# Patient Record
Sex: Female | Born: 1960 | Race: White | Hispanic: No | Marital: Married | Smoking: Never smoker
Health system: Southern US, Community
[De-identification: ages and names within clinical notes are randomized; demographics above are authoritative.]

## PROBLEM LIST (undated history)

## (undated) DIAGNOSIS — T7840XA Allergy, unspecified, initial encounter: Secondary | ICD-10-CM

## (undated) DIAGNOSIS — C801 Malignant (primary) neoplasm, unspecified: Secondary | ICD-10-CM

## (undated) DIAGNOSIS — J302 Other seasonal allergic rhinitis: Secondary | ICD-10-CM

## (undated) DIAGNOSIS — E785 Hyperlipidemia, unspecified: Secondary | ICD-10-CM

## (undated) HISTORY — PX: BREAST LUMPECTOMY: SHX2

## (undated) HISTORY — DX: Malignant (primary) neoplasm, unspecified: C80.1

## (undated) HISTORY — PX: TUBAL LIGATION: SHX77

## (undated) HISTORY — PX: TONSILECTOMY, ADENOIDECTOMY, BILATERAL MYRINGOTOMY AND TUBES: SHX2538

## (undated) HISTORY — PX: APPENDECTOMY: SHX54

## (undated) HISTORY — DX: Allergy, unspecified, initial encounter: T78.40XA

---

## 1999-09-29 HISTORY — PX: PLACEMENT OF BREAST IMPLANTS: SHX6334

## 2008-09-28 HISTORY — PX: LAPAROSCOPIC REPAIR AND REMOVAL OF GASTRIC BAND: SHX5919

## 2015-04-05 DIAGNOSIS — M25552 Pain in left hip: Secondary | ICD-10-CM | POA: Insufficient documentation

## 2016-09-28 HISTORY — PX: REPLACEMENT TOTAL KNEE: SUR1224

## 2017-01-05 DIAGNOSIS — I7300 Raynaud's syndrome without gangrene: Secondary | ICD-10-CM | POA: Insufficient documentation

## 2017-01-05 DIAGNOSIS — Z853 Personal history of malignant neoplasm of breast: Secondary | ICD-10-CM | POA: Insufficient documentation

## 2017-01-05 DIAGNOSIS — J302 Other seasonal allergic rhinitis: Secondary | ICD-10-CM | POA: Insufficient documentation

## 2017-01-05 DIAGNOSIS — R55 Syncope and collapse: Secondary | ICD-10-CM | POA: Insufficient documentation

## 2017-03-02 DIAGNOSIS — H8113 Benign paroxysmal vertigo, bilateral: Secondary | ICD-10-CM | POA: Insufficient documentation

## 2019-09-04 ENCOUNTER — Other Ambulatory Visit: Payer: Self-pay | Admitting: General Surgery

## 2019-09-04 DIAGNOSIS — R1110 Vomiting, unspecified: Secondary | ICD-10-CM

## 2019-09-15 ENCOUNTER — Ambulatory Visit
Admission: RE | Admit: 2019-09-15 | Discharge: 2019-09-15 | Disposition: A | Discharge status: Home / Self Care | Payer: Self-pay | Source: Ambulatory Visit | Attending: General Surgery | Admitting: General Surgery

## 2019-09-15 ENCOUNTER — Other Ambulatory Visit: Payer: Self-pay | Admitting: General Surgery

## 2019-09-15 DIAGNOSIS — R1110 Vomiting, unspecified: Secondary | ICD-10-CM

## 2020-03-25 ENCOUNTER — Telehealth: Payer: Self-pay | Admitting: Physician Assistant

## 2020-03-25 NOTE — Telephone Encounter (Signed)
Patient is calling to schedule a new patient referral appointment.  Appointment is scheduled for 07/09/2020 @ 2:00 with [facility].

## 2020-05-06 ENCOUNTER — Encounter: Payer: Self-pay | Admitting: Internal Medicine

## 2020-06-26 ENCOUNTER — Other Ambulatory Visit: Payer: Self-pay

## 2020-06-26 ENCOUNTER — Ambulatory Visit (AMBULATORY_SURGERY_CENTER): Payer: TRICARE For Life (TFL) | Admitting: *Deleted

## 2020-06-26 VITALS — Ht 66.0 in | Wt 190.0 lb

## 2020-06-26 DIAGNOSIS — Z1211 Encounter for screening for malignant neoplasm of colon: Secondary | ICD-10-CM

## 2020-06-26 MED ORDER — NA SULFATE-K SULFATE-MG SULF 17.5-3.13-1.6 GM/177ML PO SOLN: 1.0000 | Freq: Once | ORAL | 0 refills | Status: AC

## 2020-07-09 ENCOUNTER — Ambulatory Visit: Payer: TRICARE For Life (TFL) | Admitting: Physician Assistant

## 2020-07-10 ENCOUNTER — Encounter: Payer: Self-pay | Admitting: Internal Medicine

## 2020-07-10 ENCOUNTER — Other Ambulatory Visit: Payer: Self-pay

## 2020-07-10 ENCOUNTER — Ambulatory Visit (AMBULATORY_SURGERY_CENTER): Admitting: Internal Medicine

## 2020-07-10 VITALS — BP 120/73 | HR 53 | Temp 97.6°F | Resp 13 | Ht 66.0 in | Wt 190.0 lb

## 2020-07-10 DIAGNOSIS — Z1211 Encounter for screening for malignant neoplasm of colon: Secondary | ICD-10-CM

## 2020-07-10 MED ORDER — SODIUM CHLORIDE 0.9 % IV SOLN: 500.0000 mL | Freq: Once | INTRAVENOUS | Status: DC

## 2020-07-10 NOTE — Progress Notes (Signed)
Pt Drowsy. VSS. To PACU, report to RN. No anesthetic complications noted.  

## 2020-07-10 NOTE — Progress Notes (Signed)
C.W. vital signs. 

## 2020-07-10 NOTE — Op Note (Signed)
[facility] Patient Name: Kaitlyn Campos Procedure Date: 07/10/2020 9:32 AM MRN: [id number] Endoscopist: Docia Chuck. Henrene Pastor , MD Age: 59 Referring MD:  Date of Birth: [birth year] Gender: Female Account #: [id number] Procedure:                Colonoscopy Indications:              Routine colon cancer screening. Patient reports                            colonoscopy in [facility] 11 years                            ago (no details) Medicines:                Monitored Anesthesia Care Procedure:                Pre-Anesthesia Assessment:                           - Prior to the procedure, a History and Physical                            was performed, and patient medications and                            allergies were reviewed. The patient's tolerance of                            previous anesthesia was also reviewed. The risks                            and benefits of the procedure and the sedation                            options and risks were discussed with the patient.                            All questions were answered, and informed consent                            was obtained. Prior Anticoagulants: The patient has                            taken no previous anticoagulant or antiplatelet                            agents. ASA Grade Assessment: II - A patient with                            mild systemic disease. After reviewing the risks                            and benefits, the patient was deemed in  satisfactory condition to undergo the procedure.                           After obtaining informed consent, the colonoscope                            was passed under direct vision. Throughout the                            procedure, the patient's blood pressure, pulse, and                            oxygen saturations were monitored continuously. The                            Colonoscope was introduced through the anus  and                            advanced to the the cecum, identified by                            appendiceal orifice and ileocecal valve. The                            ileocecal valve, appendiceal orifice, and rectum                            were photographed. The quality of the bowel                            preparation was excellent. The colonoscopy was                            performed without difficulty. The patient tolerated                            the procedure well. The bowel preparation used was                            SUPREP via split dose instruction. Scope In: 9:45:22 AM Scope Out: 9:58:24 AM Scope Withdrawal Time: 0 hours 9 minutes 30 seconds  Total Procedure Duration: 0 hours 13 minutes 2 seconds  Findings:                 Multiple diverticula were found in the sigmoid                            colon.                           The exam was otherwise without abnormality. No                            retroflexion secondary to narrow rectal vault.  However, excellent view from the anal os                            photographed. Complications:            No immediate complications. Estimated blood loss:                            None. Estimated Blood Loss:     Estimated blood loss: none. Impression:               - Diverticulosis in the sigmoid colon.                           - The examination was otherwise normal .                           - No specimens collected. Recommendation:           - Repeat colonoscopy in 10 years for screening                            purposes.                           - Patient has a contact number available for                            emergencies. The signs and symptoms of potential                            delayed complications were discussed with the                            patient. Return to normal activities tomorrow.                            Written discharge instructions were  provided to the                            patient.                           - Resume previous diet.                           - Continue present medications. Docia Chuck. Henrene Pastor, MD 07/10/2020 10:02:58 AM This report has been signed electronically.

## 2020-07-10 NOTE — Patient Instructions (Signed)
Handout given: Diverticulosis Resume previous diet  continue present medications Repeat colonoscopy in 10 years   YOU HAD AN ENDOSCOPIC PROCEDURE TODAY AT [place]:   Refer to the procedure report that was given to you for any specific questions about what was found during the examination.  If the procedure report does not answer your questions, please call your gastroenterologist to clarify.  If you requested that your care partner not be given the details of your procedure findings, then the procedure report has been included in a sealed envelope for you to review at your convenience later.  YOU SHOULD EXPECT: Some feelings of bloating in the abdomen. Passage of more gas than usual.  Walking can help get rid of the air that was put into your GI tract during the procedure and reduce the bloating. If you had a lower endoscopy (such as a colonoscopy or flexible sigmoidoscopy) you may notice spotting of blood in your stool or on the toilet paper. If you underwent a bowel prep for your procedure, you may not have a normal bowel movement for a few days.  Please Note:  You might notice some irritation and congestion in your nose or some drainage.  This is from the oxygen used during your procedure.  There is no need for concern and it should clear up in a day or so.  SYMPTOMS TO REPORT IMMEDIATELY:   Following lower endoscopy (colonoscopy or flexible sigmoidoscopy):  Excessive amounts of blood in the stool  Significant tenderness or worsening of abdominal pains  Swelling of the abdomen that is new, acute  Fever of 100F or higher   For urgent or emergent issues, a gastroenterologist can be reached at any hour by calling [phone]. Do not use MyChart messaging for urgent concerns.    DIET:  We do recommend a small meal at first, but then you may proceed to your regular diet.  Drink plenty of fluids but you should avoid alcoholic beverages for 24 hours.  ACTIVITY:  You  should plan to take it easy for the rest of today and you should NOT DRIVE or use heavy machinery until tomorrow (because of the sedation medicines used during the test).    FOLLOW UP: Our staff will call the number listed on your records 48-72 hours following your procedure to check on you and address any questions or concerns that you may have regarding the information given to you following your procedure. If we do not reach you, we will leave a message.  We will attempt to reach you two times.  During this call, we will ask if you have developed any symptoms of COVID 19. If you develop any symptoms (ie: fever, flu-like symptoms, shortness of breath, cough etc.) before then, please call [phone].  If you test positive for Covid 19 in the 2 weeks post procedure, please call and report this information to [place].    If any biopsies were taken you will be contacted by phone or by letter within the next 1-3 weeks.  Please call us at [phone] if you have not heard about the biopsies in 3 weeks.    SIGNATURES/CONFIDENTIALITY: You and/or your care partner have signed paperwork which will be entered into your electronic medical record.  These signatures attest to the fact that that the information above on your After Visit Summary has been reviewed and is understood.  Full responsibility of the confidentiality of this discharge information lies with you and/or your care-partner.

## 2020-07-10 NOTE — Progress Notes (Signed)
Pt's states no medical or surgical changes since previsit or office visit. 

## 2020-07-12 ENCOUNTER — Telehealth: Payer: Self-pay | Admitting: *Deleted

## 2020-07-12 NOTE — Telephone Encounter (Signed)
  Follow up Call-  Call back number 07/10/2020  Post procedure Call Back phone  # [phone]  Permission to leave phone message Yes     Patient questions:  Do you have a fever, pain , or abdominal swelling? No. Pain Score  0 *  Have you tolerated food without any problems? Yes.    Have you been able to return to your normal activities? Yes.    Do you have any questions about your discharge instructions: Diet   No. Medications  No. Follow up visit  No.  Do you have questions or concerns about your Care? No.  Actions: * If pain score is 4 or above: 1. No action needed, pain <4.Have you developed a fever since your procedure? no  2.   Have you had an respiratory symptoms (SOB or cough) since your procedure? no  3.   Have you tested positive for COVID 19 since your procedure no  4.   Have you had any family members/close contacts diagnosed with the COVID 19 since your procedure?  no   If yes to any of these questions please route to Joylene John, RN and Joella Prince, RN

## 2020-07-31 ENCOUNTER — Other Ambulatory Visit: Payer: Self-pay

## 2020-07-31 ENCOUNTER — Ambulatory Visit (INDEPENDENT_AMBULATORY_CARE_PROVIDER_SITE_OTHER): Admitting: Dermatology

## 2020-07-31 ENCOUNTER — Encounter: Payer: Self-pay | Admitting: Dermatology

## 2020-07-31 DIAGNOSIS — Z1283 Encounter for screening for malignant neoplasm of skin: Secondary | ICD-10-CM | POA: Diagnosis not present

## 2020-08-01 ENCOUNTER — Encounter: Payer: Self-pay | Admitting: Dermatology

## 2020-08-01 NOTE — Progress Notes (Signed)
   New Patient   Subjective  Kaitlyn Campos is a 59 y.o. female who presents for the following: New Patient (Initial Visit) (Skin check no concerns).  General skin examination Location:  Duration:  Quality:  Associated Signs/Symptoms: Modifying Factors:  Severity:  Timing: Context: No history of skin cancer   The following portions of the chart were reviewed this encounter and updated as appropriate: Tobacco  Allergies  Meds  Problems  Med Hx  Surg Hx  Fam Hx      Objective  Well appearing patient in no apparent distress; mood and affect are within normal limits.  A full examination was performed including scalp, head, eyes, ears, nose, lips, neck, chest, axillae, abdomen, back, buttocks, bilateral upper extremities, bilateral lower extremities, hands, feet, fingers, toes, fingernails, and toenails. All findings within normal limits unless otherwise noted below.   Assessment & Plan  Skin exam for malignant neoplasm Head - Anterior (Face)  Yearly skin check  Skin cancer screening performed today.

## 2021-01-29 ENCOUNTER — Ambulatory Visit
Admission: RE | Admit: 2021-01-29 | Discharge: 2021-01-29 | Disposition: A | Discharge status: Home / Self Care | Source: Ambulatory Visit | Attending: Family Medicine | Admitting: Family Medicine

## 2021-01-29 ENCOUNTER — Other Ambulatory Visit: Payer: Self-pay

## 2021-01-29 VITALS — BP 125/81 | HR 72 | Temp 98.5°F | Resp 18

## 2021-01-29 DIAGNOSIS — J3489 Other specified disorders of nose and nasal sinuses: Secondary | ICD-10-CM

## 2021-01-29 MED ORDER — BACITRACIN ZINC 500 UNIT/GM EX OINT: 1.0000 "application " | TOPICAL_OINTMENT | Freq: Two times a day (BID) | CUTANEOUS | 0 refills | Status: DC

## 2021-01-29 NOTE — ED Provider Notes (Signed)
[facility]    CSN: [id number] Arrival date & time: 01/29/21  1345      History   Chief Complaint No chief complaint on file.   HPI Kaitlyn Campos is a 60 y.o. female.   Patient presenting today with a bleeding, painful sore in her right nostril the past 2 weeks or so.  Has been cleaning the area with a wet Q-tip every now and again but otherwise not tried anything over-the-counter for symptoms.  Does have a known history of seasonal allergies which have been poorly controlled on antihistamine and Singulair lately.  Denies injury to the area.     Past Medical History:  Diagnosis Date  . Allergy   . Cancer [facility])    breast cancer  right     There are no problems to display for this patient.   Past Surgical History:  Procedure Laterality Date  . APPENDECTOMY    . BREAST LUMPECTOMY Right   . LAPAROSCOPIC REPAIR AND REMOVAL OF GASTRIC BAND  2010  . PLACEMENT OF BREAST IMPLANTS Bilateral 2001  . REPLACEMENT TOTAL KNEE Right 2018  . TONSILECTOMY, ADENOIDECTOMY, BILATERAL MYRINGOTOMY AND TUBES    . TUBAL LIGATION      OB History   No obstetric history on file.      Home Medications    Prior to Admission medications   Medication Sig Start Date End Date Taking? Authorizing Provider  bacitracin ointment Apply 1 application topically 2 (two) times daily. 01/29/21  Yes Volney American, PA-C  cetirizine (ZYRTEC) 10 MG tablet Take 10 mg by mouth daily. 04/01/20   [provider]  ibuprofen (ADVIL) 800 MG tablet Take 800 mg by mouth 3 (three) times daily. 07/02/20   [provider]  lidocaine (LIDODERM) 5 % 1 patch daily. 07/02/20   [provider]  montelukast (SINGULAIR) 10 MG tablet Take 10 mg by mouth daily. 04/01/20   [provider]    Family History Family History  Problem Relation Age of Onset  . Esophageal cancer Father   . Colon cancer Neg Hx   . Rectal cancer Neg Hx   . Stomach cancer Neg Hx     Social  History Social History   Tobacco Use  . Smoking status: Never Smoker  . Smokeless tobacco: Never Used  Substance Use Topics  . Alcohol use: Never  . Drug use: Never     Allergies   Fluconazole and Nifedipine   Review of Systems Review of Systems For HPI Physical Exam Triage Vital Signs ED Triage Vitals [01/29/21 1454]  Enc Vitals Group     BP 125/81     Pulse Rate 72     Resp 18     Temp 98.5 F (36.9 C)     Temp Source Oral     SpO2 98 %     Weight      Height      Head Circumference      Peak Flow      Pain Score 2     Pain Loc      Pain Edu?      Excl. in [place]?    No data found.  Updated Vital Signs BP 125/81 (BP Location: Left Arm)   Pulse 72   Temp 98.5 F (36.9 C) (Oral)   Resp 18   SpO2 98%   Visual Acuity Right Eye Distance:   Left Eye Distance:   Bilateral Distance:    Right Eye Near:  Left Eye Near:    Bilateral Near:     Physical Exam Vitals and nursing note reviewed.  Constitutional:      Appearance: Normal appearance. She is not ill-appearing.  HENT:     Head: Atraumatic.     Nose:     Comments: Crusted lesion inside right nare, no active drainage    Mouth/Throat:     Mouth: Mucous membranes are moist.     Pharynx: Oropharynx is clear.  Eyes:     Extraocular Movements: Extraocular movements intact.     Conjunctiva/sclera: Conjunctivae normal.  Cardiovascular:     Rate and Rhythm: Normal rate and regular rhythm.     Heart sounds: Normal heart sounds.  Pulmonary:     Effort: Pulmonary effort is normal.     Breath sounds: Normal breath sounds.  Musculoskeletal:        General: Normal range of motion.     Cervical back: Normal range of motion and neck supple.  Skin:    General: Skin is warm and dry.  Neurological:     Mental Status: She is alert and oriented to person, place, and time.  Psychiatric:        Mood and Affect: Mood normal.        Thought Content: Thought content normal.        Judgment: Judgment normal.       UC Treatments / Results  Labs (all labs ordered are listed, but only abnormal results are displayed) Labs Reviewed - No data to display  EKG   Radiology No results found.  Procedures Procedures (including critical care time)  Medications Ordered in UC Medications - No data to display  Initial Impression / Assessment and Plan / UC Course  I have reviewed the triage vital signs and the nursing notes.  Pertinent labs & imaging results that were available during my care of the patient were reviewed by me and considered in my medical decision making (see chart for details).     Suspect impetigo, will treat with bacitracin ointment to the area twice daily, Aquaphor as needed in between these doses to protect the mucosa.  Discussed humidifier, continued allergy control.  ENT follow-up if not resolving.  Final Clinical Impressions(s) / UC Diagnoses   Final diagnoses:  Internal nasal lesion   Discharge Instructions   None    ED Prescriptions    Medication Sig Dispense Auth. Provider   bacitracin ointment Apply 1 application topically 2 (two) times daily. 120 g Volney American, [place]     PDMP not reviewed this encounter.   Volney American, [place] 01/29/21 1536

## 2021-01-29 NOTE — ED Triage Notes (Signed)
Pt c/o abscess like sore inside rt bottom nare for 2wks. States having pain and it bleeds a lot.

## 2021-02-18 ENCOUNTER — Other Ambulatory Visit: Payer: Self-pay

## 2021-02-18 ENCOUNTER — Ambulatory Visit
Admission: RE | Admit: 2021-02-18 | Discharge: 2021-02-18 | Disposition: A | Discharge status: Home / Self Care | Source: Ambulatory Visit | Attending: Emergency Medicine | Admitting: Emergency Medicine

## 2021-02-18 NOTE — ED Notes (Signed)
Patient here for COVID swab only due to needing a test prior to surgery out of town.  Orders placed.

## 2021-02-19 LAB — NOVEL CORONAVIRUS, NAA: SARS-CoV-2, NAA: NOT DETECTED

## 2021-02-19 LAB — SARS-COV-2, NAA 2 DAY TAT

## 2021-08-04 ENCOUNTER — Encounter: Payer: Self-pay | Admitting: Emergency Medicine

## 2021-08-04 ENCOUNTER — Ambulatory Visit
Admission: EM | Admit: 2021-08-04 | Discharge: 2021-08-04 | Disposition: A | Discharge status: Home / Self Care | Attending: Physician Assistant | Admitting: Physician Assistant

## 2021-08-04 ENCOUNTER — Other Ambulatory Visit: Payer: Self-pay

## 2021-08-04 DIAGNOSIS — J209 Acute bronchitis, unspecified: Secondary | ICD-10-CM | POA: Diagnosis not present

## 2021-08-04 MED ORDER — CHERATUSSIN AC 100-10 MG/5ML PO SOLN
5.0000 mL | Freq: Three times a day (TID) | ORAL | 0 refills | Status: AC | PRN
Start: 2021-08-04 — End: 2021-08-09

## 2021-08-04 MED ORDER — PREDNISONE 20 MG PO TABS: 40.0000 mg | ORAL_TABLET | Freq: Every day | ORAL | 0 refills | Status: AC

## 2021-08-04 NOTE — ED Triage Notes (Signed)
Patient c/o dry cough x 5 days, worse at night.  Patient has been taken Mucinex and vaccinated for COVID.

## 2021-08-04 NOTE — ED Provider Notes (Signed)
[facility]    CSN: [id number] Arrival date & time: 08/04/21  1103      History   Chief Complaint Chief Complaint  Patient presents with   Cough    HPI Kaitlyn Campos is a 60 y.o. female.   Patient here today for evaluation of chest congestion and cough that she has had for the last 5 days.  She reports that cough is not productive.  She has not had fever or chills.  She reports symptoms are similar to when she had bronchitis in the past.  She reports cough is worse at nighttime.  She has tried taking Mucinex without considerable relief.  The history is provided by the patient.  Cough Associated symptoms: ear pain (with cough), sore throat and wheezing   Associated symptoms: no chills, no eye discharge, no fever and no shortness of breath    Past Medical History:  Diagnosis Date   Allergy    Cancer ([place])    breast cancer  right     There are no problems to display for this patient.   Past Surgical History:  Procedure Laterality Date   APPENDECTOMY     BREAST LUMPECTOMY Right    LAPAROSCOPIC REPAIR AND REMOVAL OF GASTRIC BAND  2010   PLACEMENT OF BREAST IMPLANTS Bilateral 2001   REPLACEMENT TOTAL KNEE Right 2018   TONSILECTOMY, ADENOIDECTOMY, BILATERAL MYRINGOTOMY AND TUBES     TUBAL LIGATION      OB History   No obstetric history on file.      Home Medications    Prior to Admission medications   Medication Sig Start Date End Date Taking? Authorizing Provider  bacitracin ointment Apply 1 application topically 2 (two) times daily. 01/29/21  Yes Volney American, PA-C  cetirizine (ZYRTEC) 10 MG tablet Take 10 mg by mouth daily. 04/01/20  Yes [provider]  guaiFENesin-codeine (CHERATUSSIN AC) 100-10 MG/5ML syrup Take 5 mLs by mouth 3 (three) times daily as needed for up to 5 days for cough. 08/04/21 08/09/21 Yes Francene Finders, PA-C  ibuprofen (ADVIL) 800 MG tablet Take 800 mg by mouth 3 (three) times daily. 07/02/20  Yes [provider]  lidocaine (LIDODERM) 5 % 1 patch daily. 07/02/20  Yes [provider]  montelukast (SINGULAIR) 10 MG tablet Take 10 mg by mouth daily. 04/01/20  Yes [provider]  predniSONE (DELTASONE) 20 MG tablet Take 2 tablets (40 mg total) by mouth daily with breakfast for 5 days. 08/04/21 08/09/21 Yes Francene Finders, PA-C    Family History Family History  Problem Relation Age of Onset   Esophageal cancer Father    Colon cancer Neg Hx    Rectal cancer Neg Hx    Stomach cancer Neg Hx     Social History Social History   Tobacco Use   Smoking status: Never   Smokeless tobacco: Never  Substance Use Topics   Alcohol use: Never   Drug use: Never     Allergies   Fluconazole and Nifedipine   Review of Systems Review of Systems  Constitutional:  Negative for chills and fever.  HENT:  Positive for congestion, ear pain (with cough) and sore throat. Negative for sinus pressure.   Eyes:  Negative for discharge and redness.  Respiratory:  Positive for cough and wheezing. Negative for shortness of breath.   Gastrointestinal:  Negative for diarrhea, nausea and vomiting.    Physical Exam Triage Vital Signs ED Triage Vitals [08/04/21 1146]  Enc  Vitals Group     BP (!) 150/92     Pulse Rate 71     Resp      Temp 97.7 F (36.5 C)     Temp Source Oral     SpO2 91 %     Weight 185 lb (83.9 kg)     Height 5\' 6"  (1.676 m)     Head Circumference      Peak Flow      Pain Score 0     Pain Loc      Pain Edu?      Excl. in [place]?    No data found.  Updated Vital Signs BP (!) 150/92 (BP Location: Left Arm)   Pulse 71   Temp 97.7 F (36.5 C) (Oral)   Ht 5\' 6"  (1.676 m)   Wt 185 lb (83.9 kg)   SpO2 91%   BMI 29.86 kg/m      Physical Exam Vitals and nursing note reviewed.  Constitutional:      General: She is not in acute distress.    Appearance: Normal appearance. She is not ill-appearing.  HENT:     Head: Normocephalic and atraumatic.     Right  Ear: Tympanic membrane normal.     Left Ear: Tympanic membrane normal.     Nose: Congestion present.     Mouth/Throat:     Mouth: Mucous membranes are moist.     Pharynx: No oropharyngeal exudate or posterior oropharyngeal erythema.  Eyes:     Conjunctiva/sclera: Conjunctivae normal.  Cardiovascular:     Rate and Rhythm: Normal rate and regular rhythm.     Heart sounds: Normal heart sounds. No murmur heard. Pulmonary:     Effort: Pulmonary effort is normal. No respiratory distress.     Breath sounds: Normal breath sounds. No wheezing, rhonchi or rales.  Skin:    General: Skin is warm and dry.  Neurological:     Mental Status: She is alert.  Psychiatric:        Mood and Affect: Mood normal.        Thought Content: Thought content normal.     UC Treatments / Results  Labs (all labs ordered are listed, but only abnormal results are displayed) Labs Reviewed - No data to display  EKG   Radiology No results found.  Procedures Procedures (including critical care time)  Medications Ordered in UC Medications - No data to display  Initial Impression / Assessment and Plan / UC Course  I have reviewed the triage vital signs and the nursing notes.  Pertinent labs & imaging results that were available during my care of the patient were reviewed by me and considered in my medical decision making (see chart for details).  Will treat to cover bronchitis with steroid burst.  Recommended follow-up if symptoms fail to improve or worsen anyway.  Final Clinical Impressions(s) / UC Diagnoses   Final diagnoses:  Acute bronchitis, unspecified organism   Discharge Instructions   None    ED Prescriptions     Medication Sig Dispense Auth. Provider   predniSONE (DELTASONE) 20 MG tablet Take 2 tablets (40 mg total) by mouth daily with breakfast for 5 days. 10 tablet Ewell Poe F, PA-C   guaiFENesin-codeine (CHERATUSSIN AC) 100-10 MG/5ML syrup Take 5 mLs by mouth 3 (three) times  daily as needed for up to 5 days for cough. 50 mL Francene Finders, PA-C      PDMP not reviewed this encounter.  Francene Finders, PA-C 08/04/21 1235

## 2021-08-05 ENCOUNTER — Ambulatory Visit

## 2021-08-05 ENCOUNTER — Ambulatory Visit: Admitting: Dermatology

## 2021-10-12 ENCOUNTER — Ambulatory Visit
Admission: EM | Admit: 2021-10-12 | Discharge: 2021-10-12 | Disposition: A | Discharge status: Home / Self Care | Attending: Internal Medicine | Admitting: Internal Medicine

## 2021-10-12 ENCOUNTER — Encounter: Payer: Self-pay | Admitting: Emergency Medicine

## 2021-10-12 DIAGNOSIS — H109 Unspecified conjunctivitis: Secondary | ICD-10-CM | POA: Diagnosis not present

## 2021-10-12 MED ORDER — ERYTHROMYCIN 5 MG/GM OP OINT: TOPICAL_OINTMENT | OPHTHALMIC | 0 refills | Status: DC

## 2021-10-12 NOTE — ED Provider Notes (Signed)
[facility]    CSN: [id number] Arrival date & time: 10/12/21  1258      History   Chief Complaint Chief Complaint  Patient presents with   Conjunctivitis    HPI Kaitlyn Campos is a 61 y.o. female.   Patient presents with left eye redness and crustiness that started yesterday.  Denies any traumas or foreign bodies to the eye.  She reports that she has had an associated cough but denies nasal congestion, runny nose, fever.  Her grandson has had similar symptoms recently.  Patient does not wear glasses or contacts.  Denies any blurry vision.  Denies pain to the eye.  She reports that it feels like it is spreading to her right eye.   Conjunctivitis   Past Medical History:  Diagnosis Date   Allergy    Cancer ([place])    breast cancer  right     There are no problems to display for this patient.   Past Surgical History:  Procedure Laterality Date   APPENDECTOMY     BREAST LUMPECTOMY Right    LAPAROSCOPIC REPAIR AND REMOVAL OF GASTRIC BAND  2010   PLACEMENT OF BREAST IMPLANTS Bilateral 2001   REPLACEMENT TOTAL KNEE Right 2018   TONSILECTOMY, ADENOIDECTOMY, BILATERAL MYRINGOTOMY AND TUBES     TUBAL LIGATION      OB History   No obstetric history on file.      Home Medications    Prior to Admission medications   Medication Sig Start Date End Date Taking? Authorizing Provider  bacitracin ointment Apply 1 application topically 2 (two) times daily. 01/29/21  Yes Volney American, PA-C  cetirizine (ZYRTEC) 10 MG tablet Take 10 mg by mouth daily. 04/01/20  Yes [provider]  erythromycin ophthalmic ointment Place a 1/2 inch ribbon of ointment into the lower eyelid 4 times daily for 7 days. 10/12/21  Yes Shakir Petrosino, Hildred Alamin E, FNP  ibuprofen (ADVIL) 800 MG tablet Take 800 mg by mouth 3 (three) times daily. 07/02/20  Yes [provider]  lidocaine (LIDODERM) 5 % 1 patch daily. 07/02/20  Yes [provider]  montelukast (SINGULAIR) 10 MG  tablet Take 10 mg by mouth daily. 04/01/20  Yes [provider]    Family History Family History  Problem Relation Age of Onset   Esophageal cancer Father    Colon cancer Neg Hx    Rectal cancer Neg Hx    Stomach cancer Neg Hx     Social History Social History   Tobacco Use   Smoking status: Never   Smokeless tobacco: Never  Substance Use Topics   Alcohol use: Never   Drug use: Never     Allergies   Fluconazole and Nifedipine   Review of Systems Review of Systems Per HPI  Physical Exam Triage Vital Signs ED Triage Vitals [10/12/21 1346]  Enc Vitals Group     BP 119/66     Pulse Rate 85     Resp 18     Temp 98.9 F (37.2 C)     Temp Source Oral     SpO2 98 %     Weight 185 lb (83.9 kg)     Height 5\' 6"  (1.676 m)     Head Circumference      Peak Flow      Pain Score 0     Pain Loc      Pain Edu?      Excl. in [place]?    No data  found.  Updated Vital Signs BP 119/66 (BP Location: Left Arm)    Pulse 85    Temp 98.9 F (37.2 C) (Oral)    Resp 18    Ht 5\' 6"  (1.676 m)    Wt 185 lb (83.9 kg)    SpO2 98%    BMI 29.86 kg/m   Visual Acuity Right Eye Distance: 20 30 Left Eye Distance: 20 40 Bilateral Distance: 20 30  Right Eye Near:   Left Eye Near:    Bilateral Near:     Physical Exam Constitutional:      General: She is not in acute distress.    Appearance: Normal appearance. She is not toxic-appearing or diaphoretic.  HENT:     Head: Normocephalic and atraumatic.  Eyes:     General: Lids are normal. Lids are everted, no foreign bodies appreciated. Vision grossly intact. Gaze aligned appropriately.        Right eye: No foreign body, discharge or hordeolum.        Left eye: No foreign body, discharge or hordeolum.     Extraocular Movements: Extraocular movements intact.     Conjunctiva/sclera:     Right eye: Right conjunctiva is not injected. No chemosis or exudate.    Left eye: Left conjunctiva is injected. No chemosis, exudate or  hemorrhage.    Pupils: Pupils are equal, round, and reactive to light.  Pulmonary:     Effort: Pulmonary effort is normal.  Neurological:     General: No focal deficit present.     Mental Status: She is alert and oriented to person, place, and time. Mental status is at baseline.  Psychiatric:        Mood and Affect: Mood normal.        Behavior: Behavior normal.        Thought Content: Thought content normal.        Judgment: Judgment normal.     UC Treatments / Results  Labs (all labs ordered are listed, but only abnormal results are displayed) Labs Reviewed - No data to display  EKG   Radiology No results found.  Procedures Procedures (including critical care time)  Medications Ordered in UC Medications - No data to display  Initial Impression / Assessment and Plan / UC Course  I have reviewed the triage vital signs and the nursing notes.  Pertinent labs & imaging results that were available during my care of the patient were reviewed by me and considered in my medical decision making (see chart for details).     Will treat left bacterial conjunctivitis with erythromycin ointment.  Discussed supportive care and symptom management with patient.  Visual acuity normal today in urgent care.  Discussed return precautions.  Patient verbalized understanding and was agreeable with plan. Final Clinical Impressions(s) / UC Diagnoses   Final diagnoses:  Bacterial conjunctivitis of left eye     Discharge Instructions      You have pinkeye of your left eye which is being treated with antibiotic ointment.  Please change pillowcase daily.    ED Prescriptions     Medication Sig Dispense Auth. Provider   erythromycin ophthalmic ointment Place a 1/2 inch ribbon of ointment into the lower eyelid 4 times daily for 7 days. 3.5 g Teodora Medici, [place]      PDMP not reviewed this encounter.   Teodora Medici, [place] 10/12/21 1404

## 2021-10-12 NOTE — ED Triage Notes (Signed)
Patient c/o left eye redness, possible pink eye x 1 day, left eye is itching, eye was "real cruddy" upon waking.  Patient does not wear glasses or contacts.

## 2021-10-12 NOTE — Discharge Instructions (Signed)
You have pinkeye of your left eye which is being treated with antibiotic ointment.  Please change pillowcase daily.

## 2021-11-26 ENCOUNTER — Other Ambulatory Visit: Payer: Self-pay | Admitting: General Surgery

## 2021-11-26 ENCOUNTER — Other Ambulatory Visit (HOSPITAL_COMMUNITY): Payer: Self-pay | Admitting: General Surgery

## 2021-11-26 DIAGNOSIS — K449 Diaphragmatic hernia without obstruction or gangrene: Secondary | ICD-10-CM

## 2021-11-26 DIAGNOSIS — Z9884 Bariatric surgery status: Secondary | ICD-10-CM

## 2021-11-26 DIAGNOSIS — E6609 Other obesity due to excess calories: Secondary | ICD-10-CM

## 2021-11-26 DIAGNOSIS — E7800 Pure hypercholesterolemia, unspecified: Secondary | ICD-10-CM

## 2021-12-08 ENCOUNTER — Other Ambulatory Visit (HOSPITAL_COMMUNITY)

## 2021-12-08 ENCOUNTER — Encounter (HOSPITAL_COMMUNITY): Payer: Self-pay

## 2021-12-10 ENCOUNTER — Ambulatory Visit (HOSPITAL_COMMUNITY)
Admission: RE | Admit: 2021-12-10 | Discharge: 2021-12-10 | Disposition: A | Discharge status: Home / Self Care | Payer: BC Managed Care – PPO | Source: Ambulatory Visit | Attending: General Surgery | Admitting: General Surgery

## 2021-12-10 ENCOUNTER — Other Ambulatory Visit: Payer: Self-pay

## 2021-12-10 DIAGNOSIS — E6609 Other obesity due to excess calories: Secondary | ICD-10-CM | POA: Diagnosis present

## 2021-12-10 DIAGNOSIS — K449 Diaphragmatic hernia without obstruction or gangrene: Secondary | ICD-10-CM | POA: Diagnosis present

## 2021-12-10 DIAGNOSIS — Z9884 Bariatric surgery status: Secondary | ICD-10-CM | POA: Insufficient documentation

## 2021-12-10 DIAGNOSIS — E7800 Pure hypercholesterolemia, unspecified: Secondary | ICD-10-CM | POA: Insufficient documentation

## 2021-12-11 ENCOUNTER — Other Ambulatory Visit (HOSPITAL_COMMUNITY)

## 2022-02-16 ENCOUNTER — Ambulatory Visit
Admission: EM | Admit: 2022-02-16 | Discharge: 2022-02-16 | Disposition: A | Discharge status: Home / Self Care | Payer: BC Managed Care – PPO | Attending: Family Medicine | Admitting: Family Medicine

## 2022-02-16 DIAGNOSIS — L0291 Cutaneous abscess, unspecified: Secondary | ICD-10-CM | POA: Diagnosis not present

## 2022-02-16 MED ORDER — CEPHALEXIN 500 MG PO CAPS: 500.0000 mg | ORAL_CAPSULE | Freq: Two times a day (BID) | ORAL | 0 refills | Status: DC

## 2022-02-16 MED ORDER — MUPIROCIN CALCIUM 2 % EX CREA: 1.0000 "application " | TOPICAL_CREAM | Freq: Two times a day (BID) | CUTANEOUS | 0 refills | Status: DC

## 2022-02-16 MED ORDER — CHLORHEXIDINE GLUCONATE 4 % EX LIQD: Freq: Every day | CUTANEOUS | 0 refills | Status: DC | PRN

## 2022-02-16 NOTE — ED Provider Notes (Signed)
[facility]    CSN: [id number] Arrival date & time: 02/16/22  1051      History   Chief Complaint Chief Complaint  Patient presents with   APPOINTMENT: Abscess    HPI Kaitlyn Campos is a 61 y.o. female.   Presenting today with a swollen painful area to the right inner thigh that has been present for about a month.  She is unsure what initially started the bump but has gotten more swollen and red and painful the past few days.  Denies fever, chills, drainage, bleeding, pain rating down the leg.  Not tried anything over-the-counter for symptoms.   Past Medical History:  Diagnosis Date   Allergy    Cancer [facility])    breast cancer  right     There are no problems to display for this patient.   Past Surgical History:  Procedure Laterality Date   APPENDECTOMY     BREAST LUMPECTOMY Right    LAPAROSCOPIC REPAIR AND REMOVAL OF GASTRIC BAND  2010   PLACEMENT OF BREAST IMPLANTS Bilateral 2001   REPLACEMENT TOTAL KNEE Right 2018   TONSILECTOMY, ADENOIDECTOMY, BILATERAL MYRINGOTOMY AND TUBES     TUBAL LIGATION      OB History   No obstetric history on file.      Home Medications    Prior to Admission medications   Medication Sig Start Date End Date Taking? Authorizing Provider  cephALEXin (KEFLEX) 500 MG capsule Take 1 capsule (500 mg total) by mouth 2 (two) times daily. 02/16/22  Yes Volney American, PA-C  chlorhexidine (HIBICLENS) 4 % external liquid Apply topically daily as needed. 02/16/22  Yes Volney American, PA-C  mupirocin cream (BACTROBAN) 2 % Apply 1 application. topically 2 (two) times daily. 02/16/22  Yes Volney American, PA-C  bacitracin ointment Apply 1 application topically 2 (two) times daily. 01/29/21   Volney American, PA-C  cetirizine (ZYRTEC) 10 MG tablet Take 10 mg by mouth daily. 04/01/20   [provider]  erythromycin ophthalmic ointment Place a 1/2 inch ribbon of ointment into the lower eyelid 4 times daily  for 7 days. 10/12/21   Teodora Medici, FNP  ibuprofen (ADVIL) 800 MG tablet Take 800 mg by mouth 3 (three) times daily. 07/02/20   [provider]  lidocaine (LIDODERM) 5 % 1 patch daily. 07/02/20   [provider]  montelukast (SINGULAIR) 10 MG tablet Take 10 mg by mouth daily. 04/01/20   [provider]    Family History Family History  Problem Relation Age of Onset   Esophageal cancer Father    Colon cancer Neg Hx    Rectal cancer Neg Hx    Stomach cancer Neg Hx     Social History Social History   Tobacco Use   Smoking status: Never   Smokeless tobacco: Never  Substance Use Topics   Alcohol use: Never   Drug use: Never     Allergies   Fluconazole and Nifedipine   Review of Systems Review of Systems Per HPI  Physical Exam Triage Vital Signs ED Triage Vitals  Enc Vitals Group     BP 02/16/22 1105 113/77     Pulse Rate 02/16/22 1105 61     Resp 02/16/22 1105 17     Temp 02/16/22 1105 98.1 F (36.7 C)     Temp Source 02/16/22 1105 Oral     SpO2 02/16/22 1105 99 %     Weight --  Height --      Head Circumference --      Peak Flow --      Pain Score 02/16/22 1104 4     Pain Loc --      Pain Edu? --      Excl. in [place]? --    No data found.  Updated Vital Signs BP 113/77 (BP Location: Left Arm)   Pulse 61   Temp 98.1 F (36.7 C) (Oral)   Resp 17   SpO2 99%   Visual Acuity Right Eye Distance:   Left Eye Distance:   Bilateral Distance:    Right Eye Near:   Left Eye Near:    Bilateral Near:     Physical Exam Vitals and nursing note reviewed.  Constitutional:      Appearance: Normal appearance. She is not ill-appearing.  HENT:     Head: Atraumatic.  Eyes:     Extraocular Movements: Extraocular movements intact.     Conjunctiva/sclera: Conjunctivae normal.  Cardiovascular:     Rate and Rhythm: Normal rate and regular rhythm.     Heart sounds: Normal heart sounds.  Pulmonary:     Effort: Pulmonary effort is normal.      Breath sounds: Normal breath sounds.  Musculoskeletal:        General: Normal range of motion.     Cervical back: Normal range of motion and neck supple.  Skin:    General: Skin is warm and dry.     Findings: Erythema present.     Comments: 0.5 cm firm erythematous nodular area with surrounding erythema, small pustular central region without active drainage or bleeding.  Appears to be overlying a varicose vein  Neurological:     Mental Status: She is alert and oriented to person, place, and time.     Motor: No weakness.     Gait: Gait normal.  Psychiatric:        Mood and Affect: Mood normal.        Thought Content: Thought content normal.        Judgment: Judgment normal.     UC Treatments / Results  Labs (all labs ordered are listed, but only abnormal results are displayed) Labs Reviewed - No data to display  EKG   Radiology No results found.  Procedures Procedures (including critical care time)  Medications Ordered in UC Medications - No data to display  Initial Impression / Assessment and Plan / UC Course  I have reviewed the triage vital signs and the nursing notes.  Pertinent labs & imaging results that were available during my care of the patient were reviewed by me and considered in my medical decision making (see chart for details).     Small early abscess, will forego I&D today as there is no fluctuance or induration and the proximity to a varicose vein causing potential for significant bleeding.  Warm compresses, Keflex, Hibiclens, mupirocin, keep covered.  Return for worsening symptoms.  Final Clinical Impressions(s) / UC Diagnoses   Final diagnoses:  Abscess   Discharge Instructions   None    ED Prescriptions     Medication Sig Dispense Auth. Provider   cephALEXin (KEFLEX) 500 MG capsule Take 1 capsule (500 mg total) by mouth 2 (two) times daily. 14 capsule Volney American, [place]   mupirocin cream (BACTROBAN) 2 % Apply 1 application.  topically 2 (two) times daily. 15 g Volney American, [place]   chlorhexidine (HIBICLENS) 4 % external liquid Apply topically daily  as needed. 120 mL Volney American, [place]      PDMP not reviewed this encounter.   Volney American, [place] 02/16/22 1131

## 2022-02-16 NOTE — ED Triage Notes (Signed)
Pt presents with abscess on right inner thigh area X 1 month.

## 2022-03-07 ENCOUNTER — Ambulatory Visit
Admission: RE | Admit: 2022-03-07 | Discharge: 2022-03-07 | Disposition: A | Discharge status: Home / Self Care | Payer: BC Managed Care – PPO | Source: Ambulatory Visit | Attending: Emergency Medicine | Admitting: Emergency Medicine

## 2022-03-07 ENCOUNTER — Other Ambulatory Visit: Payer: Self-pay

## 2022-03-07 VITALS — BP 128/84 | HR 77 | Temp 98.1°F | Resp 18

## 2022-03-07 DIAGNOSIS — J989 Respiratory disorder, unspecified: Secondary | ICD-10-CM | POA: Diagnosis not present

## 2022-03-07 DIAGNOSIS — R058 Other specified cough: Secondary | ICD-10-CM

## 2022-03-07 DIAGNOSIS — J309 Allergic rhinitis, unspecified: Secondary | ICD-10-CM | POA: Diagnosis not present

## 2022-03-07 MED ORDER — PROMETHAZINE-DM 6.25-15 MG/5ML PO SYRP: 5.0000 mL | ORAL_SOLUTION | Freq: Four times a day (QID) | ORAL | 0 refills | Status: DC | PRN

## 2022-03-07 MED ORDER — METHYLPREDNISOLONE SODIUM SUCC 40 MG IJ SOLR
40.0000 mg | Freq: Once | INTRAMUSCULAR | Status: AC
  Administered 2022-03-07: 40 mg via INTRAMUSCULAR

## 2022-03-07 MED ORDER — BENZONATATE 100 MG PO CAPS: 100.0000 mg | ORAL_CAPSULE | Freq: Three times a day (TID) | ORAL | 0 refills | Status: DC

## 2022-03-07 NOTE — Discharge Instructions (Addendum)
Please continue Zyrtec and Singulair as you have been taking them in the past to reduce the frequency and severity of respiratory infections year-round.  Your symptoms and physical exam findings are concerning for a viral respiratory infection.  Because respiratory allergies are not well controlled at this time, this makes you more susceptible to catching respiratory infections. To avoid catching frequent respiratory infections, having skin reactions, dealing with eye irritation, losing sleep, missing work, etc., due to uncontrolled allergies, it is important that you begin/continue your allergy regimen and are consistent with taking your meds exactly as prescribed.   Please see the list below for recommended medications, dosages and frequencies to provide relief of current symptoms:     Solu-Medrol IM (methylprednisolone):  To quickly address your significant respiratory inflammation, you were provided with an injection of Solu-Medrol in the office today.  You should continue to feel the full benefit of the steroid for the next 4 to 6 hours.    Zyrtec (cetirizine): This is an excellent second-generation antihistamine that helps to reduce respiratory inflammatory response to environmental allergens.  In some patients, this medication can cause daytime sleepiness so I recommend that you take 1 tablet daily at bedtime.     Singulair (montelukast): This is a mast cell stabilizer that works well with antihistamines.  Mast cells are responsible for stimulating histamine production so you can imagine that if we can reduce the activity of your mast cells, then fewer histamines will be produced and inflammation caused by allergy exposure will be significantly reduced.  I recommend that you take this medication at the same time you take your antihistamine.  Provided you with a refill of Tessalon Perles that you can take for your daytime cough and a prescription for Promethazine DM for nighttime cough.  I do not  prescribe narcotic cough medications such as Tussionex.   If you find that you have not had improvement of your symptoms in the next 5 to 7 days, please follow-up with your primary care provider or return here to urgent care for repeat evaluation and further recommendations.   Thank you for visiting urgent care today.  We appreciate the opportunity to participate in your care.

## 2022-03-07 NOTE — ED Triage Notes (Signed)
Pt here for cough x 3 days that is worse at night

## 2022-03-07 NOTE — ED Provider Notes (Signed)
[facility]    CSN: [id number] Arrival date & time: 03/07/22  1247    HISTORY   Chief Complaint  Patient presents with   Cough    Cough, throat , congestion. - Entered by patient   Appointment    [place]   HPI Kaitlyn Campos is a 61 y.o. female. Patient presents to urgent care complaining of a cough for the past 3 days that is worse at night.  Patient states she gets bronchitis at least once a year.  Patient states she takes allergy medications in the spring and fall, not currently taking at this time.  Patient states she takes Zyrtec and Singulair.  Patient is requesting a prescription for Tussionex and Tessalon Perles which she states is what she is usually prescribed by her regular provider she has an "flareup".  Patient states her husband has been sick at home but he is feeling better, states she thinks he is feeling better because he recently got a prescription for antibiotics.  EMR reviewed, patient was prescribed Keflex on May 22 to treat an abscess, patient states she just finished taking it.  Patient is stating she thinks she might need an antibiotic.  Patient has normal vital signs on arrival and is in no acute distress.  The history is provided by the patient.   Past Medical History:  Diagnosis Date   Allergy    Cancer [facility])    breast cancer  right    There are no problems to display for this patient.  Past Surgical History:  Procedure Laterality Date   APPENDECTOMY     BREAST LUMPECTOMY Right    LAPAROSCOPIC REPAIR AND REMOVAL OF GASTRIC BAND  2010   PLACEMENT OF BREAST IMPLANTS Bilateral 2001   REPLACEMENT TOTAL KNEE Right 2018   TONSILECTOMY, ADENOIDECTOMY, BILATERAL MYRINGOTOMY AND TUBES     TUBAL LIGATION     OB History   No obstetric history on file.    Home Medications    Prior to Admission medications   Medication Sig Start Date End Date Taking? Authorizing Provider  benzonatate (TESSALON) 100 MG capsule Take 1 capsule (100 mg total) by  mouth every 8 (eight) hours. 03/07/22  Yes Lynden Oxford Scales, PA-C  promethazine-dextromethorphan (PROMETHAZINE-DM) 6.25-15 MG/5ML syrup Take 5 mLs by mouth 4 (four) times daily as needed for cough. 03/07/22  Yes Lynden Oxford Scales, PA-C   Family History Family History  Problem Relation Age of Onset   Esophageal cancer Father    Colon cancer Neg Hx    Rectal cancer Neg Hx    Stomach cancer Neg Hx    Social History Social History   Tobacco Use   Smoking status: Never   Smokeless tobacco: Never  Substance Use Topics   Alcohol use: Never   Drug use: Never   Allergies   Fluconazole and Nifedipine  Review of Systems Review of Systems Pertinent findings noted in history of present illness.   Physical Exam Triage Vital Signs ED Triage Vitals  Enc Vitals Group     BP 07/25/21 0827 (!) 147/82     Pulse Rate 07/25/21 0827 72     Resp 07/25/21 0827 18     Temp 07/25/21 0827 98.3 F (36.8 C)     Temp Source 07/25/21 0827 Oral     SpO2 07/25/21 0827 98 %     Weight --      Height --      Head Circumference --  Peak Flow --      Pain Score 07/25/21 0826 5     Pain Loc --      Pain Edu? --      Excl. in [place]? --   No data found.  Updated Vital Signs BP 128/84 (BP Location: Left Arm)   Pulse 77   Temp 98.1 F (36.7 C) (Oral)   Resp 18   SpO2 94%   Physical Exam Vitals and nursing note reviewed.  Constitutional:      General: She is not in acute distress.    Appearance: Normal appearance. She is not ill-appearing.  HENT:     Head: Normocephalic and atraumatic.     Salivary Glands: Right salivary gland is not diffusely enlarged or tender. Left salivary gland is not diffusely enlarged or tender.     Right Ear: Ear canal and external ear normal. No drainage. A middle ear effusion is present. There is no impacted cerumen. Tympanic membrane is bulging. Tympanic membrane is not injected or erythematous.     Left Ear: Ear canal and external ear normal. No drainage.  A middle ear effusion is present. There is no impacted cerumen. Tympanic membrane is bulging. Tympanic membrane is not injected or erythematous.     Ears:     Comments: Bilateral EACs normal, both TMs bulging with clear fluid    Nose: Rhinorrhea present. No nasal deformity, septal deviation, signs of injury, nasal tenderness, mucosal edema or congestion. Rhinorrhea is clear.     Right Nostril: Occlusion present. No foreign body, epistaxis or septal hematoma.     Left Nostril: Occlusion present. No foreign body, epistaxis or septal hematoma.     Right Turbinates: Enlarged, swollen and pale.     Left Turbinates: Enlarged, swollen and pale.     Right Sinus: No maxillary sinus tenderness or frontal sinus tenderness.     Left Sinus: No maxillary sinus tenderness or frontal sinus tenderness.     Mouth/Throat:     Lips: Pink. No lesions.     Mouth: Mucous membranes are moist. No oral lesions.     Pharynx: Oropharynx is clear. Uvula midline. No posterior oropharyngeal erythema or uvula swelling.     Tonsils: No tonsillar exudate. 0 on the right. 0 on the left.     Comments: Postnasal drip Eyes:     General: Lids are normal.        Right eye: No discharge.        Left eye: No discharge.     Extraocular Movements: Extraocular movements intact.     Conjunctiva/sclera: Conjunctivae normal.     Right eye: Right conjunctiva is not injected.     Left eye: Left conjunctiva is not injected.  Neck:     Trachea: Trachea and phonation normal.  Cardiovascular:     Rate and Rhythm: Normal rate and regular rhythm.     Pulses: Normal pulses.     Heart sounds: Normal heart sounds. No murmur heard.    No friction rub. No gallop.  Pulmonary:     Effort: Pulmonary effort is normal. No accessory muscle usage, prolonged expiration or respiratory distress.     Breath sounds: Normal breath sounds. No stridor, decreased air movement or transmitted upper airway sounds. No decreased breath sounds, wheezing, rhonchi or  rales.  Chest:     Chest wall: No tenderness.  Musculoskeletal:        General: Normal range of motion.     Cervical back: Normal range of motion  and neck supple. Normal range of motion.  Lymphadenopathy:     Cervical: No cervical adenopathy.  Skin:    General: Skin is warm and dry.     Findings: No erythema or rash.  Neurological:     General: No focal deficit present.     Mental Status: She is alert and oriented to person, place, and time.  Psychiatric:        Mood and Affect: Mood normal.        Behavior: Behavior normal.     Visual Acuity Right Eye Distance:   Left Eye Distance:   Bilateral Distance:    Right Eye Near:   Left Eye Near:    Bilateral Near:     UC Couse / Diagnostics / Procedures:    EKG  Radiology No results found.  Procedures Procedures (including critical care time)  UC Diagnoses / Final Clinical Impressions(s)   I have reviewed the triage vital signs and the nursing notes.  Pertinent labs & imaging results that were available during my care of the patient were reviewed by me and considered in my medical decision making (see chart for details).   Final diagnoses:  Allergic rhinitis with postnasal drip  Nonproductive cough  Allergic disorder of respiratory system, sequela   Patient advised that physical exam findings are concerning for allergies at this time.  Patient advised that because she has annual symptoms biannual episodes of bronchitis I believe this is due to under controlled allergy symptoms.  Patient advised to take her Singulair and Zyrtec year-round.  Patient advised that I do not prescribe Tussionex as this is a narcotic.  Patient provided with a prescription for promethazine and Tessalon Perles to take in addition to her Singulair and Zyrtec.  Patient provided with an injection of methylprednisolone to calm her allergy symptoms at this time.  Return precautions advised.  ED Prescriptions     Medication Sig Dispense Auth. Provider    promethazine-dextromethorphan (PROMETHAZINE-DM) 6.25-15 MG/5ML syrup Take 5 mLs by mouth 4 (four) times daily as needed for cough. 180 mL Lynden Oxford Scales, PA-C   benzonatate (TESSALON) 100 MG capsule Take 1 capsule (100 mg total) by mouth every 8 (eight) hours. 21 capsule Lynden Oxford Scales, PA-C      PDMP not reviewed this encounter.  Pending results:  Labs Reviewed - No data to display  Medications Ordered in UC: Medications  methylPREDNISolone sodium succinate (SOLU-MEDROL) 40 mg/mL injection 40 mg (has no administration in time range)    Disposition Upon Discharge:  Condition: stable for discharge home Home: take medications as prescribed; routine discharge instructions as discussed; follow up as advised.  Patient presented with an acute illness with associated systemic symptoms and significant discomfort requiring urgent management. In my opinion, this is a condition that a prudent lay person (someone who possesses an average knowledge of health and medicine) may potentially expect to result in complications if not addressed urgently such as respiratory distress, impairment of bodily function or dysfunction of bodily organs.   Routine symptom specific, illness specific and/or disease specific instructions were discussed with the patient and/or caregiver at length.   As such, the patient has been evaluated and assessed, work-up was performed and treatment was provided in alignment with urgent care protocols and evidence based medicine.  Patient/parent/caregiver has been advised that the patient may require follow up for further testing and treatment if the symptoms continue in spite of treatment, as clinically indicated and appropriate.  If the patient was tested  for COVID-19, Influenza and/or RSV, then the patient/parent/guardian was advised to isolate at home pending the results of his/her diagnostic coronavirus test and potentially longer if they're positive. I have also  advised pt that if his/her COVID-19 test returns positive, it's recommended to self-isolate for at least 10 days after symptoms first appeared AND until fever-free for 24 hours without fever reducer AND other symptoms have improved or resolved. Discussed self-isolation recommendations as well as instructions for household member/close contacts as per the [facility] and [place] DHHS, and also gave patient the [facility] packet with this information.  Patient/parent/caregiver has been advised to return to the [facility] or PCP in 3-5 days if no better; to PCP or the Emergency Department if new signs and symptoms develop, or if the current signs or symptoms continue to change or worsen for further workup, evaluation and treatment as clinically indicated and appropriate  The patient will follow up with their current PCP if and as advised. If the patient does not currently have a PCP we will assist them in obtaining one.   The patient may need specialty follow up if the symptoms continue, in spite of conservative treatment and management, for further workup, evaluation, consultation and treatment as clinically indicated and appropriate.  Patient/parent/caregiver verbalized understanding and agreement of plan as discussed.  All questions were addressed during visit.  Please see discharge instructions below for further details of plan.  Discharge Instructions:   Discharge Instructions      Please continue Zyrtec and Singulair as you have been taking them in the past to reduce the frequency and severity of respiratory infections year-round.  Your symptoms and physical exam findings are concerning for a viral respiratory infection.  Because respiratory allergies are not well controlled at this time, this makes you more susceptible to catching respiratory infections. To avoid catching frequent respiratory infections, having skin reactions, dealing with eye irritation, losing sleep, missing work, etc., due to uncontrolled  allergies, it is important that you begin/continue your allergy regimen and are consistent with taking your meds exactly as prescribed.   Please see the list below for recommended medications, dosages and frequencies to provide relief of current symptoms:     Solu-Medrol IM (methylprednisolone):  To quickly address your significant respiratory inflammation, you were provided with an injection of Solu-Medrol in the office today.  You should continue to feel the full benefit of the steroid for the next 4 to 6 hours.    Zyrtec (cetirizine): This is an excellent second-generation antihistamine that helps to reduce respiratory inflammatory response to environmental allergens.  In some patients, this medication can cause daytime sleepiness so I recommend that you take 1 tablet daily at bedtime.     Singulair (montelukast): This is a mast cell stabilizer that works well with antihistamines.  Mast cells are responsible for stimulating histamine production so you can imagine that if we can reduce the activity of your mast cells, then fewer histamines will be produced and inflammation caused by allergy exposure will be significantly reduced.  I recommend that you take this medication at the same time you take your antihistamine.  Provided you with a refill of Tessalon Perles that you can take for your daytime cough and a prescription for Promethazine DM for nighttime cough.  I do not prescribe narcotic cough medications such as Tussionex.   If you find that you have not had improvement of your symptoms in the next 5 to 7 days, please follow-up with your primary care provider or return  here to urgent care for repeat evaluation and further recommendations.   Thank you for visiting urgent care today.  We appreciate the opportunity to participate in your care.     This office note has been dictated using Museum/gallery curator.  Unfortunately, and despite my best efforts, this method of dictation  can sometimes lead to occasional typographical or grammatical errors.  I apologize in advance if this occurs.     Lynden Oxford Scales, [place] 03/08/22 [phone]

## 2022-03-14 ENCOUNTER — Ambulatory Visit (INDEPENDENT_AMBULATORY_CARE_PROVIDER_SITE_OTHER): Payer: BC Managed Care – PPO

## 2022-03-14 ENCOUNTER — Ambulatory Visit
Admission: RE | Admit: 2022-03-14 | Discharge: 2022-03-14 | Disposition: A | Discharge status: Home / Self Care | Payer: BC Managed Care – PPO | Source: Ambulatory Visit

## 2022-03-14 VITALS — BP 124/83 | HR 91 | Temp 99.5°F | Resp 18

## 2022-03-14 DIAGNOSIS — R059 Cough, unspecified: Secondary | ICD-10-CM

## 2022-03-14 DIAGNOSIS — J989 Respiratory disorder, unspecified: Secondary | ICD-10-CM

## 2022-03-14 DIAGNOSIS — T7840XD Allergy, unspecified, subsequent encounter: Secondary | ICD-10-CM | POA: Diagnosis not present

## 2022-03-14 DIAGNOSIS — J9801 Acute bronchospasm: Secondary | ICD-10-CM | POA: Diagnosis not present

## 2022-03-14 DIAGNOSIS — R0989 Other specified symptoms and signs involving the circulatory and respiratory systems: Secondary | ICD-10-CM | POA: Diagnosis not present

## 2022-03-14 MED ORDER — AEROCHAMBER PLUS FLO-VU LARGE MISC: 1.0000 | Freq: Once | 0 refills | Status: AC

## 2022-03-14 MED ORDER — ALBUTEROL SULFATE HFA 108 (90 BASE) MCG/ACT IN AERS: 2.0000 | INHALATION_SPRAY | Freq: Four times a day (QID) | RESPIRATORY_TRACT | 0 refills | Status: DC | PRN

## 2022-03-14 MED ORDER — AZITHROMYCIN 250 MG PO TABS
ORAL_TABLET | ORAL | 0 refills | Status: AC
Start: 2022-03-14 — End: 2022-03-19

## 2022-03-14 NOTE — Discharge Instructions (Addendum)
Fortunately, your chest x-ray was not concerning for any signs of acute infection or inflammation.  I have provided you with a prescription for azithromycin which should eliminate any of the bacteria that is complicating your rhinosinusitis, postnasal drip.    I also provided you with an albuterol inhaler to help you with your cough.  This will be much more effective than cough syrup.  Please inhale 2 puffs every time you begin to cough and inhale 2 puffs before bedtime.  I also strongly recommend that you continue using Mucinex as this has been so effective to break up and produce phlegm when you are coughing.    Please continue your allergy medications as prescribed.    Please follow-up with your primary care provider if you have not had complete resolution of your symptoms in the next 5 to 7 days.  Thank you for visiting urgent care.

## 2022-03-14 NOTE — ED Provider Notes (Signed)
[facility]    CSN: [id number] Arrival date & time: 03/14/22  1254    HISTORY   Chief Complaint  Patient presents with   Cough    Sinus, bad cough , heavy congestion , with brown / green  coughing up , going on 2 nd week . Was at clinic a week ago. - Entered by patient   HPI Kaitlyn Campos is a 61 y.o. female. Patient presents urgent care after having been seen by me at another urgent care a week ago complaining of a "flareup" of bronchitis.  Per my exam findings, patient was requesting antibiotics at the time, EMR reviewed, patient had finished a 7-day course of Keflex a few days prior to her urgent care visit, patient was provided with a steroid injection and advised to resume previously prescribed allergy medications.  Today, patient is complaining of a worsening cough productive of brown/green sputum, sinusitis for the past 2 weeks.  Of note, patient did not have a productive cough or any brown/green sputum or nasal drainage when I saw her 7 days ago.  Patient has a slightly elevated temperature on arrival, vital signs are otherwise normal, of note her oxygen is actually improved compared to last time.  Patient states the steroid injection did not provide her with any relief whatsoever, states the Promethazine DM cough syrup did not help at all, states she is still taking allergy medications and has decided to begin taking Mucinex which does seem to be "breaking up" the phlegm in her chest.  Patient coughed several times during her visit today without any production of sputum.  Patient does complain of tightness in her chest when she is coughing.  The history is provided by the patient.   Past Medical History:  Diagnosis Date   Allergy    Cancer [facility])    breast cancer  right    There are no problems to display for this patient.  Past Surgical History:  Procedure Laterality Date   APPENDECTOMY     BREAST LUMPECTOMY Right    LAPAROSCOPIC REPAIR AND REMOVAL OF GASTRIC BAND   2010   PLACEMENT OF BREAST IMPLANTS Bilateral 2001   REPLACEMENT TOTAL KNEE Right 2018   TONSILECTOMY, ADENOIDECTOMY, BILATERAL MYRINGOTOMY AND TUBES     TUBAL LIGATION     OB History   No obstetric history on file.    Home Medications    Prior to Admission medications   Medication Sig Start Date End Date Taking? Authorizing Provider  benzonatate (TESSALON) 100 MG capsule Take 1 capsule (100 mg total) by mouth every 8 (eight) hours. 03/07/22   Lynden Oxford Scales, PA-C  promethazine-dextromethorphan (PROMETHAZINE-DM) 6.25-15 MG/5ML syrup Take 5 mLs by mouth 4 (four) times daily as needed for cough. 03/07/22   Lynden Oxford Scales, PA-C   Family History Family History  Problem Relation Age of Onset   Esophageal cancer Father    Colon cancer Neg Hx    Rectal cancer Neg Hx    Stomach cancer Neg Hx    Social History Social History   Tobacco Use   Smoking status: Never   Smokeless tobacco: Never  Substance Use Topics   Alcohol use: Never   Drug use: Never   Allergies   Fluconazole and Nifedipine  Review of Systems Review of Systems Pertinent findings noted in history of present illness.   Physical Exam Triage Vital Signs ED Triage Vitals  Enc Vitals Group     BP 07/25/21 0827 (!) 147/82  Pulse Rate 07/25/21 0827 72     Resp 07/25/21 0827 18     Temp 07/25/21 0827 98.3 F (36.8 C)     Temp Source 07/25/21 0827 Oral     SpO2 07/25/21 0827 98 %     Weight --      Height --      Head Circumference --      Peak Flow --      Pain Score 07/25/21 0826 5     Pain Loc --      Pain Edu? --      Excl. in [place]? --   No data found.  Updated Vital Signs BP 124/83 (BP Location: Right Arm)   Pulse 91   Temp 99.5 F (37.5 C) (Oral)   Resp 18   SpO2 96%   Physical Exam Vitals and nursing note reviewed.  Constitutional:      General: She is not in acute distress.    Appearance: Normal appearance. She is not ill-appearing.  HENT:     Head: Normocephalic and  atraumatic.     Salivary Glands: Right salivary gland is not diffusely enlarged or tender. Left salivary gland is not diffusely enlarged or tender.     Right Ear: Ear canal and external ear normal. No drainage. No middle ear effusion. There is no impacted cerumen. Tympanic membrane is bulging. Tympanic membrane is not injected or erythematous.     Left Ear: Ear canal and external ear normal. No drainage.  No middle ear effusion. There is no impacted cerumen. Tympanic membrane is bulging. Tympanic membrane is not injected or erythematous.     Ears:     Comments: Bilateral EACs normal, both TMs bulging with clear fluid    Nose: Rhinorrhea present. No nasal deformity, septal deviation, signs of injury, nasal tenderness, mucosal edema or congestion. Rhinorrhea is clear.     Right Nostril: No foreign body, epistaxis, septal hematoma or occlusion.     Left Nostril: No foreign body, epistaxis, septal hematoma or occlusion.     Right Turbinates: Swollen and pale. Not enlarged.     Left Turbinates: Swollen and pale. Not enlarged.     Right Sinus: No maxillary sinus tenderness or frontal sinus tenderness.     Left Sinus: No maxillary sinus tenderness or frontal sinus tenderness.     Mouth/Throat:     Lips: Pink. No lesions.     Mouth: Mucous membranes are moist. No oral lesions.     Tongue: No lesions.     Palate: No mass.     Pharynx: Oropharynx is clear. Uvula midline. No pharyngeal swelling, oropharyngeal exudate, posterior oropharyngeal erythema or uvula swelling.     Tonsils: No tonsillar exudate. 0 on the right. 0 on the left.     Comments: Postnasal drip Eyes:     General: Lids are normal.        Right eye: No discharge.        Left eye: No discharge.     Extraocular Movements: Extraocular movements intact.     Conjunctiva/sclera: Conjunctivae normal.     Right eye: Right conjunctiva is not injected.     Left eye: Left conjunctiva is not injected.  Neck:     Trachea: Trachea and phonation  normal.  Cardiovascular:     Rate and Rhythm: Normal rate and regular rhythm.     Pulses: Normal pulses.     Heart sounds: Normal heart sounds. No murmur heard.    No friction rub.  No gallop.  Pulmonary:     Effort: Pulmonary effort is normal. No tachypnea, bradypnea, accessory muscle usage, prolonged expiration, respiratory distress or retractions.     Breath sounds: No stridor, decreased air movement or transmitted upper airway sounds. Examination of the left-middle field reveals decreased breath sounds. Examination of the left-lower field reveals decreased breath sounds. Decreased breath sounds present. No wheezing, rhonchi or rales.  Chest:     Chest wall: No tenderness.  Musculoskeletal:        General: Normal range of motion.     Cervical back: Full passive range of motion without pain, normal range of motion and neck supple. Normal range of motion.  Lymphadenopathy:     Cervical: No cervical adenopathy.  Skin:    General: Skin is warm and dry.     Findings: No erythema or rash.  Neurological:     General: No focal deficit present.     Mental Status: She is alert and oriented to person, place, and time.  Psychiatric:        Mood and Affect: Mood normal.        Behavior: Behavior normal.     Visual Acuity Right Eye Distance:   Left Eye Distance:   Bilateral Distance:    Right Eye Near:   Left Eye Near:    Bilateral Near:     UC Couse / Diagnostics / Procedures:    EKG  Radiology No results found.  Procedures Procedures (including critical care time)  UC Diagnoses / Final Clinical Impressions(s)   I have reviewed the triage vital signs and the nursing notes.  Pertinent labs & imaging results that were available during my care of the patient were reviewed by me and considered in my medical decision making (see chart for details).   Final diagnoses:  Bronchospasm, acute  Allergic disorder of respiratory system, subsequent encounter   Chest x-ray normal.   Patient provided with azithromycin and albuterol and advised to continue allergy medications and Mucinex.  Patient advised to follow-up with PCP in 5 to 7 days if not improved.  ED Prescriptions     Medication Sig Dispense Auth. Provider   azithromycin (ZITHROMAX) 250 MG tablet Take 2 tablets (500 mg total) by mouth daily for 1 day, THEN 1 tablet (250 mg total) daily for 4 days. 6 tablet Lynden Oxford Scales, PA-C   albuterol (VENTOLIN HFA) 108 (90 Base) MCG/ACT inhaler Inhale 2 puffs into the lungs every 6 (six) hours as needed for wheezing or shortness of breath (Cough). 18 g Lynden Oxford Scales, PA-C   Spacer/Aero-Holding Chambers (AEROCHAMBER PLUS FLO-VU LARGE) MISC 1 each by Other route once for 1 dose. 1 each Lynden Oxford Scales, PA-C      PDMP not reviewed this encounter.  Pending results:  Labs Reviewed - No data to display  Medications Ordered in UC: Medications - No data to display  Disposition Upon Discharge:  Condition: stable for discharge home Home: take medications as prescribed; routine discharge instructions as discussed; follow up as advised.  Patient presented with an acute illness with associated systemic symptoms and significant discomfort requiring urgent management. In my opinion, this is a condition that a prudent lay person (someone who possesses an average knowledge of health and medicine) may potentially expect to result in complications if not addressed urgently such as respiratory distress, impairment of bodily function or dysfunction of bodily organs.   Routine symptom specific, illness specific and/or disease specific instructions were discussed with the patient and/or  caregiver at length.   As such, the patient has been evaluated and assessed, work-up was performed and treatment was provided in alignment with urgent care protocols and evidence based medicine.  Patient/parent/caregiver has been advised that the patient may require follow up for  further testing and treatment if the symptoms continue in spite of treatment, as clinically indicated and appropriate.  If the patient was tested for COVID-19, Influenza and/or RSV, then the patient/parent/guardian was advised to isolate at home pending the results of his/her diagnostic coronavirus test and potentially longer if they're positive. I have also advised pt that if his/her COVID-19 test returns positive, it's recommended to self-isolate for at least 10 days after symptoms first appeared AND until fever-free for 24 hours without fever reducer AND other symptoms have improved or resolved. Discussed self-isolation recommendations as well as instructions for household member/close contacts as per the [facility] and  DHHS, and also gave patient the [place] packet with this information.  Patient/parent/caregiver has been advised to return to the [facility] or PCP in 3-5 days if no better; to PCP or the Emergency Department if new signs and symptoms develop, or if the current signs or symptoms continue to change or worsen for further workup, evaluation and treatment as clinically indicated and appropriate  The patient will follow up with their current PCP if and as advised. If the patient does not currently have a PCP we will assist them in obtaining one.   The patient may need specialty follow up if the symptoms continue, in spite of conservative treatment and management, for further workup, evaluation, consultation and treatment as clinically indicated and appropriate.  Patient/parent/caregiver verbalized understanding and agreement of plan as discussed.  All questions were addressed during visit.  Please see discharge instructions below for further details of plan.  Discharge Instructions:   Discharge Instructions      Fortunately, your chest x-ray was not concerning for any signs of acute infection or inflammation.  I have provided you with a prescription for azithromycin which should eliminate any of  the bacteria that is complicating your rhinosinusitis, postnasal drip.    I also provided you with an albuterol inhaler to help you with your cough.  This will be much more effective than cough syrup.  Please inhale 2 puffs every time you begin to cough and inhale 2 puffs before bedtime.  I also strongly recommend that you continue using Mucinex as this has been so effective to break up and produce phlegm when you are coughing.    Please continue your allergy medications as prescribed.    Please follow-up with your primary care provider if you have not had complete resolution of your symptoms in the next 5 to 7 days.  Thank you for visiting urgent care.      This office note has been dictated using Museum/gallery curator.  Unfortunately, and despite my best efforts, this method of dictation can sometimes lead to occasional typographical or grammatical errors.  I apologize in advance if this occurs.     Lynden Oxford Scales, PA-C 03/14/22 1352

## 2022-03-14 NOTE — ED Triage Notes (Signed)
Patient went to [facility] last week and at that point it was a dry cough but started taking Mucinex and now coughing up "brown and green" phlegm. Patient states she coughed all night. Some chest tightness and pain when patient coughs.

## 2022-03-14 NOTE — ED Notes (Signed)
Patient went to [facility] last week and at that point it was a dry cough but started taking Mucinex and now coughing up "brown and green" phlegm. Patient states she coughed all night. Some Chest Tightness and pain when patient coughs.

## 2022-05-10 IMAGING — RF DG UGI W SINGLE CM
7 of 9 series · 14 of 21 positions shown · non-contrast
Comparison: UGI 09/15/2019

CLINICAL DATA: Weight change. Patient with history of lap band
placed 12 years ago, recently deflated. She has plans for gastric
sleeve surgery.

EXAM:
DG UGI W SINGLE CM
TECHNIQUE: Scout radiograph was obtained. Combined double and single contrast
examination was performed using effervescent crystals, high-density
barium and thin liquid barium. This exam was performed by Charlot
Pilo Nedd, and was supervised and interpreted by Dr. Creekmore
D[place]anta.
FLUOROSCOPY:
Radiation Exposure Index (as provided by the fluoroscopic device):
21.2 mGy Kerma

[Series 1: t abdomen supine · 0.15mm/px · 1 of 1 slices shown]
[im 1/1]
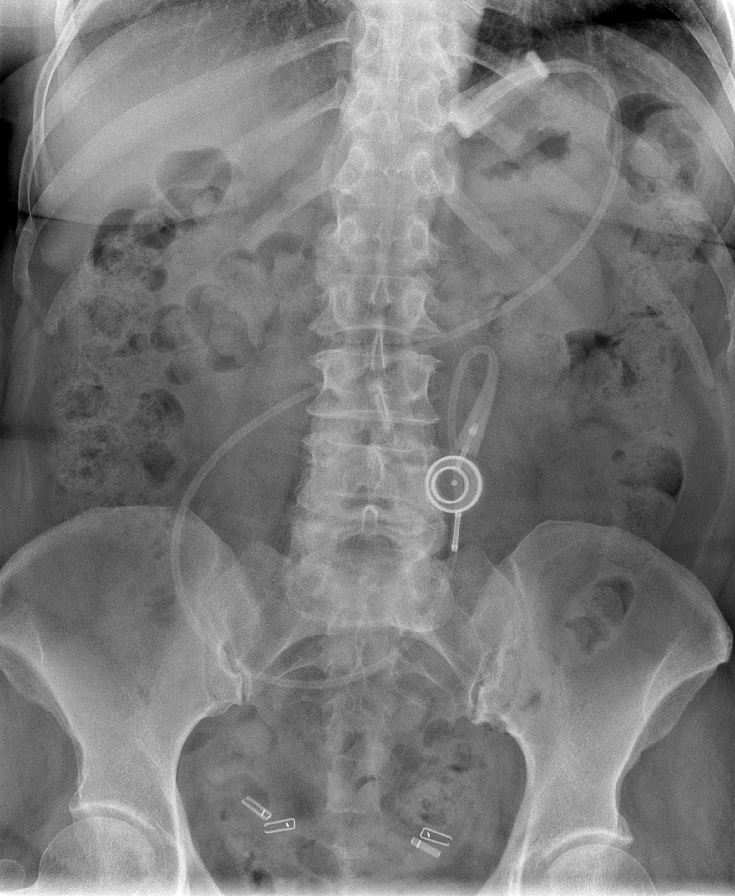

[Series 2: cp_standard · 0.51mm/px · 2 of 76 frames shown (1 of 5)]
[frame 39/76]
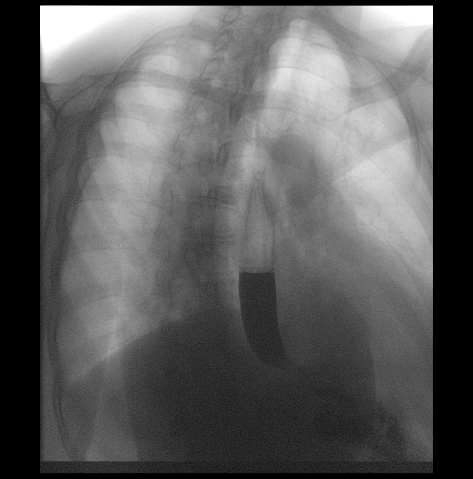
[frame 42/76]
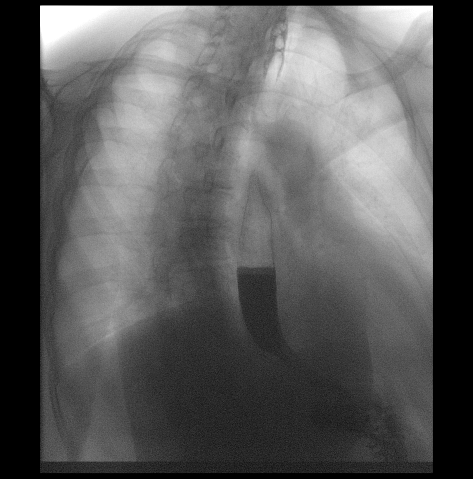

[Series 3: cp_standard · 0.25mm/px · 1 of 1 slices shown (2 of 5)]
[im 1/1]
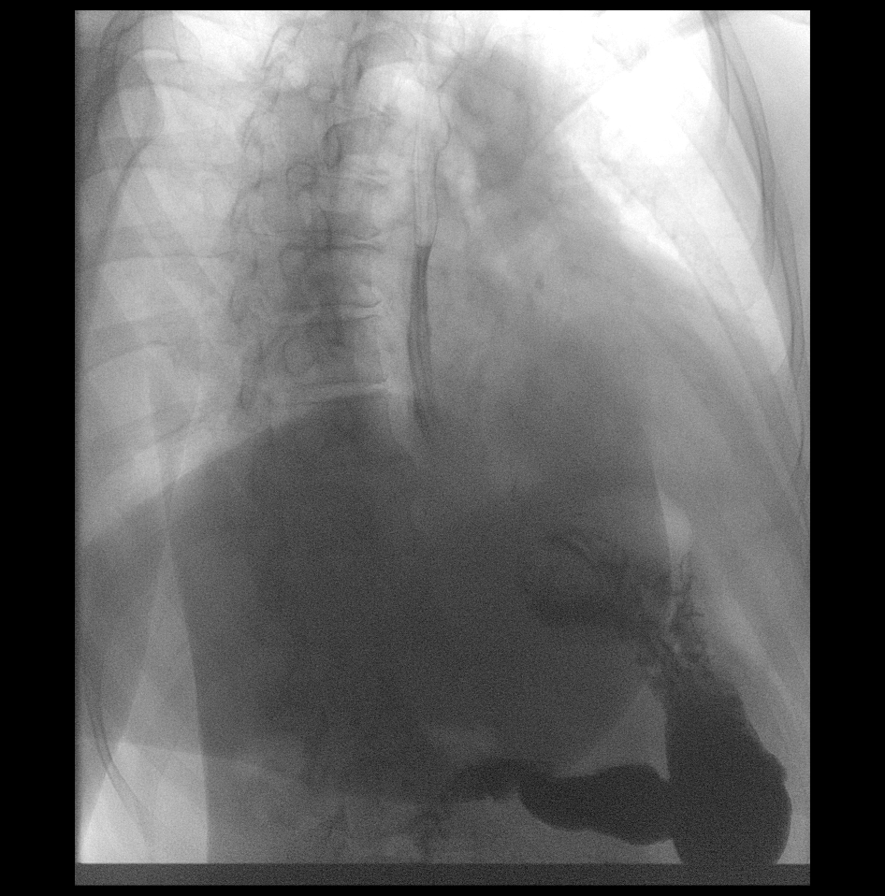

[Series 4: fluoro_barium singleshot_bw · 0.17mm/px · 1 of 1 slices shown]
[im 1/1]
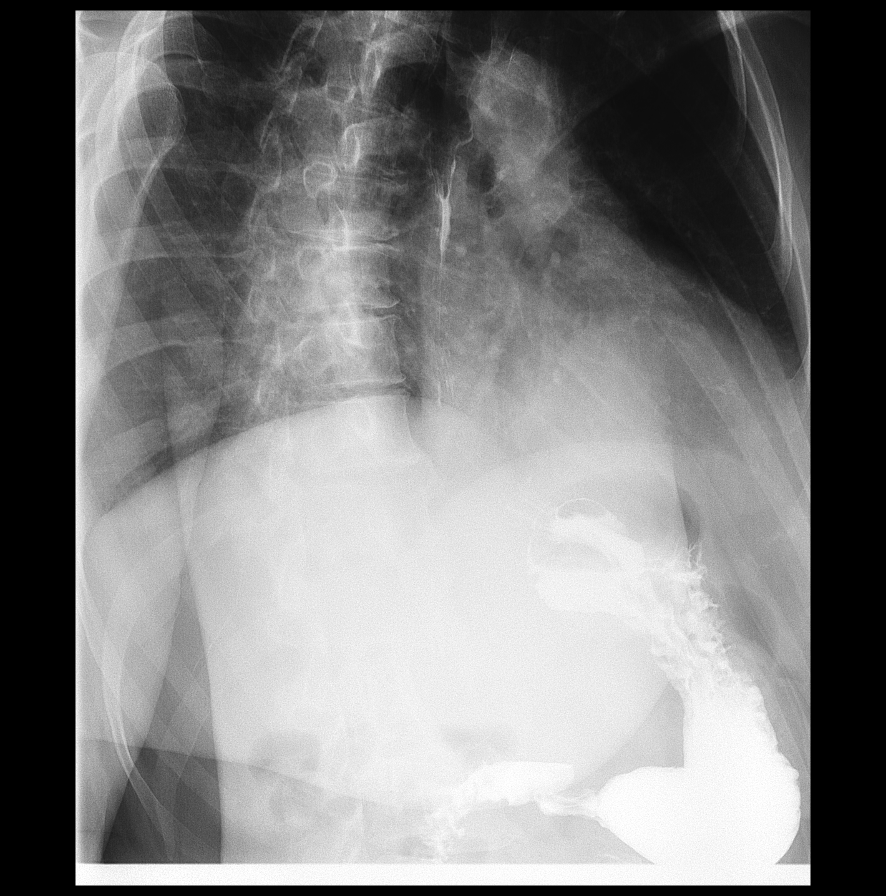

[Series 6: cp_standard · 0.54mm/px · 3 of 179 frames shown (3 of 5)]
[frame 27/179]
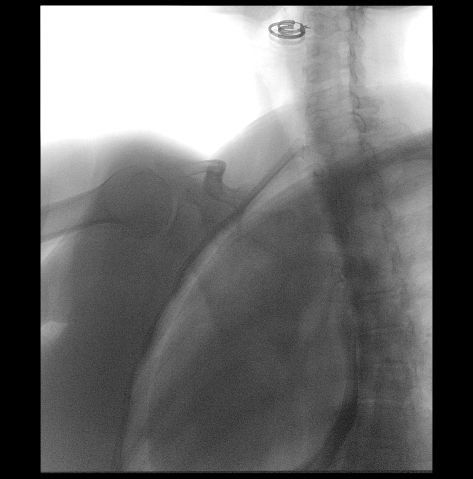
[frame 82/179]
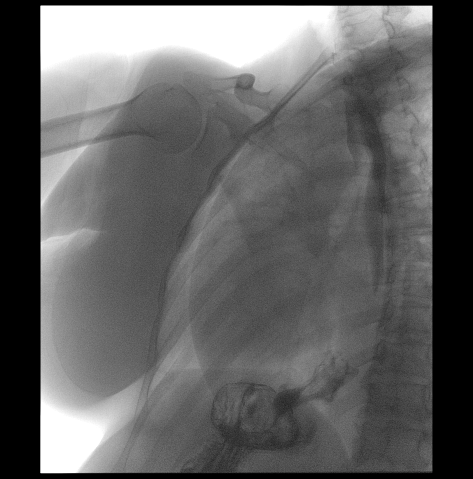
[frame 153/179]
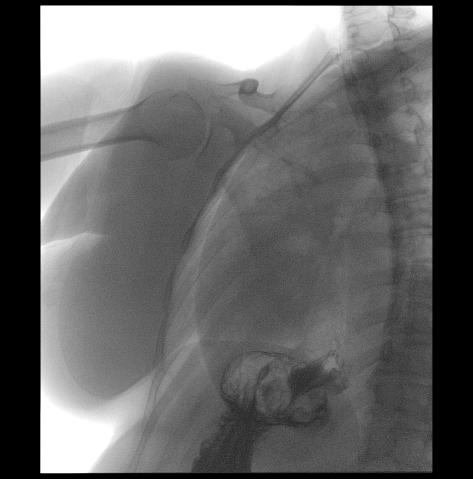

[Series 7: cp_standard · 0.56mm/px · 3 of 117 frames shown (4 of 5)]
[frame 18/117]
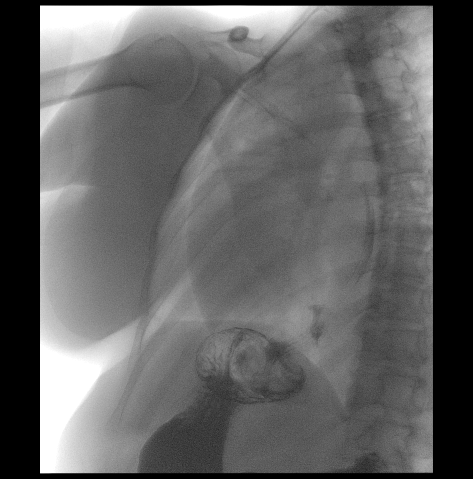
[frame 86/117]
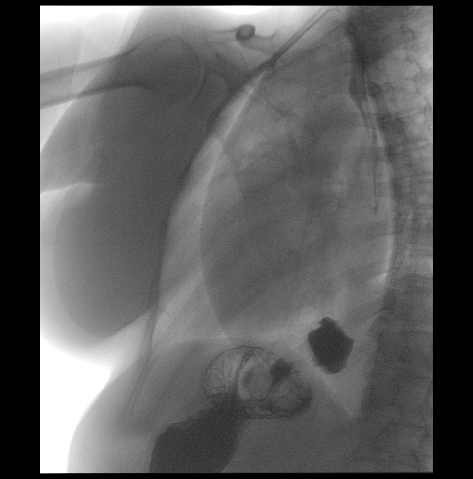
[frame 100/117]
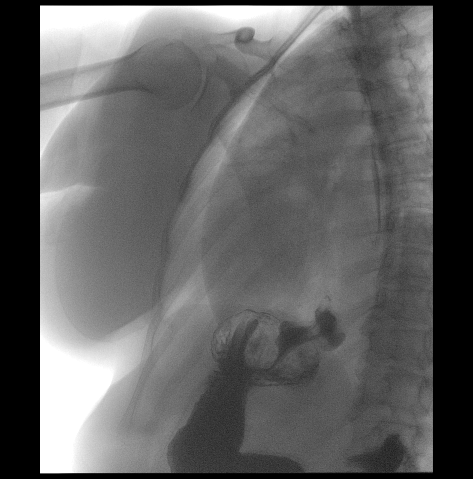

[Series 9: cp_standard · 0.55mm/px · 3 of 8 frames shown (5 of 5)]
[frame 2/8]
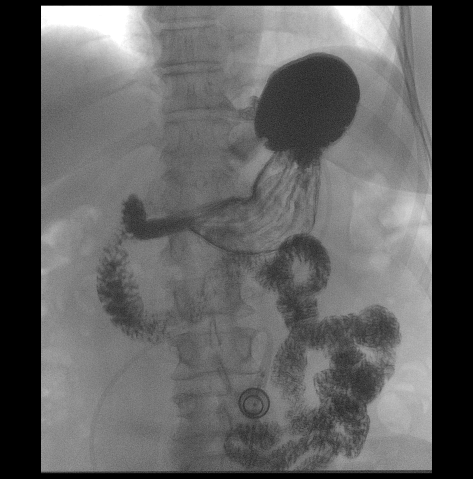
[frame 5/8]
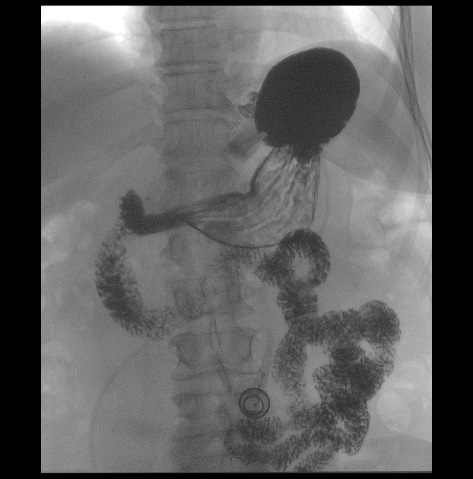
[frame 8/8]
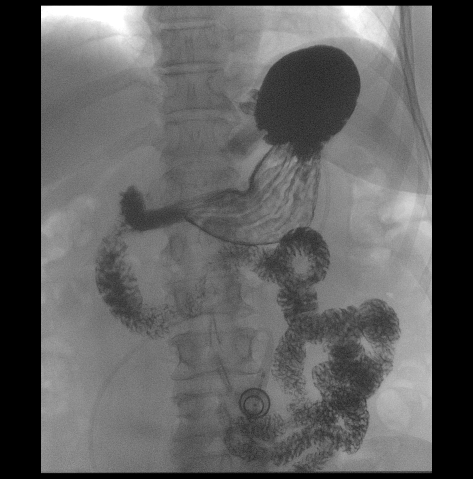

[14 of 21 positions shown; findings below may reference images not displayed]

FINDINGS: Scout Radiograph: Normal bowel gas pattern. Lap band apparatus is
present.

Esophagus: Unremarkable.  No mass or stricture.

Esophageal motility: Within normal limits.

Gastroesophageal reflux: No spontaneous reflux identified.

Ingested 13mm barium tablet: Not given

Stomach: Very small hiatal hernia.

Gastric emptying: Normal.

Duodenum: Normal appearance.

Other:  None.
IMPRESSION: Lap band present but deflated.

Very small hiatal hernia.  No spontaneous gastroesophageal reflux.

## 2022-07-21 ENCOUNTER — Emergency Department (HOSPITAL_BASED_OUTPATIENT_CLINIC_OR_DEPARTMENT_OTHER)
Admission: EM | Admit: 2022-07-21 | Discharge: 2022-07-21 | Disposition: A | Discharge status: Home / Self Care | Payer: BC Managed Care – PPO | Attending: Emergency Medicine | Admitting: Emergency Medicine

## 2022-07-21 ENCOUNTER — Emergency Department (HOSPITAL_BASED_OUTPATIENT_CLINIC_OR_DEPARTMENT_OTHER): Payer: BC Managed Care – PPO

## 2022-07-21 ENCOUNTER — Other Ambulatory Visit: Payer: Self-pay

## 2022-07-21 ENCOUNTER — Encounter (HOSPITAL_BASED_OUTPATIENT_CLINIC_OR_DEPARTMENT_OTHER): Payer: Self-pay | Admitting: Emergency Medicine

## 2022-07-21 ENCOUNTER — Ambulatory Visit (INDEPENDENT_AMBULATORY_CARE_PROVIDER_SITE_OTHER)
Admission: RE | Admit: 2022-07-21 | Discharge: 2022-07-21 | Disposition: A | Discharge status: Home / Self Care | Payer: BC Managed Care – PPO | Source: Ambulatory Visit | Attending: Emergency Medicine | Admitting: Emergency Medicine

## 2022-07-21 VITALS — BP 113/78 | HR 65 | Temp 97.8°F | Resp 18

## 2022-07-21 DIAGNOSIS — M79605 Pain in left leg: Secondary | ICD-10-CM | POA: Insufficient documentation

## 2022-07-21 DIAGNOSIS — J029 Acute pharyngitis, unspecified: Secondary | ICD-10-CM

## 2022-07-21 DIAGNOSIS — Z853 Personal history of malignant neoplasm of breast: Secondary | ICD-10-CM | POA: Insufficient documentation

## 2022-07-21 DIAGNOSIS — Z1152 Encounter for screening for COVID-19: Secondary | ICD-10-CM | POA: Diagnosis not present

## 2022-07-21 DIAGNOSIS — Z20822 Contact with and (suspected) exposure to covid-19: Secondary | ICD-10-CM

## 2022-07-21 DIAGNOSIS — T7840XA Allergy, unspecified, initial encounter: Secondary | ICD-10-CM | POA: Insufficient documentation

## 2022-07-21 DIAGNOSIS — R202 Paresthesia of skin: Secondary | ICD-10-CM | POA: Diagnosis present

## 2022-07-21 LAB — CBC
HCT: 43.7 % (ref 36.0–46.0)
Hemoglobin: 14.4 g/dL (ref 12.0–15.0)
MCH: 28.7 pg (ref 26.0–34.0)
MCHC: 33 g/dL (ref 30.0–36.0)
MCV: 87.1 fL (ref 80.0–100.0)
Platelets: 196 10*3/uL (ref 150–400)
RBC: 5.02 MIL/uL (ref 3.87–5.11)
RDW: 13.2 % (ref 11.5–15.5)
WBC: 4.9 10*3/uL (ref 4.0–10.5)
nRBC: 0 % (ref 0.0–0.2)

## 2022-07-21 LAB — COMPREHENSIVE METABOLIC PANEL
ALT: 17 U/L (ref 0–44)
AST: 20 U/L (ref 15–41)
Albumin: 4.5 g/dL (ref 3.5–5.0)
Alkaline Phosphatase: 59 U/L (ref 38–126)
Anion gap: 7 (ref 5–15)
BUN: 12 mg/dL (ref 8–23)
CO2: 27 mmol/L (ref 22–32)
Calcium: 9.6 mg/dL (ref 8.9–10.3)
Chloride: 105 mmol/L (ref 98–111)
Creatinine, Ser: 1.11 mg/dL — ABNORMAL HIGH (ref 0.44–1.00)
GFR, Estimated: 56 mL/min — ABNORMAL LOW (ref >60)
Glucose, Bld: 82 mg/dL (ref 70–99)
Potassium: 4.3 mmol/L (ref 3.5–5.1)
Sodium: 139 mmol/L (ref 135–145)
Total Bilirubin: 0.5 mg/dL (ref 0.3–1.2)
Total Protein: 7.3 g/dL (ref 6.5–8.1)

## 2022-07-21 LAB — RESP PANEL BY RT-PCR (FLU A&B, COVID) ARPGX2
Influenza A by PCR: NEGATIVE
Influenza B by PCR: NEGATIVE
SARS Coronavirus 2 by RT PCR: NEGATIVE

## 2022-07-21 NOTE — ED Triage Notes (Signed)
Pt via pov from UC with red and painful thigh since Thursday. UC told her it doesn't appear to be muscular and sent her here for [place]. Pt states it only hurts when it is being touched. Pt denies any sob or other symptoms. Pt alert & oriented, nad noted.

## 2022-07-21 NOTE — Discharge Instructions (Addendum)
Please follow-up with your primary care doctor.  If you develop redness, swelling, rash in the leg please return to the ER, urgent care or PCP.  Rash is concerning for signs of shingles.  Use Tylenol for pain control, and use ice and heat for control of pain as well.

## 2022-07-21 NOTE — ED Triage Notes (Signed)
Pt presents with left leg pain that is tender to touch X 4 days.

## 2022-07-21 NOTE — ED Provider Notes (Signed)
[facility] Provider Note   CSN: [id number] Arrival date & time: 07/21/22  1538     History  No chief complaint on file.   Kaitlyn Campos is a 61 y.o. female, history of breast cancer, who presents to the ED secondary to painful left thigh for the past 4 days.  States that she was working out about 4 days ago, and started having some tingling sensation in the left thigh, states it is only painful when touching it or having it brushed against something.  Denies any redness, swelling to the thigh.  Went to see urgent care and was sent here for an ultrasound.  Denies any kind of difficulty weightbearing, or trauma.  Notes that she was doing more exercises at the gym, that earlier that day, and then had pain later that night.  Has not taken anything for the pain.  Denies any numbness, endorses some sun burning sensation when touched.  HPI     Home Medications Prior to Admission medications   Medication Sig Start Date End Date Taking? Authorizing Provider  albuterol (VENTOLIN HFA) 108 (90 Base) MCG/ACT inhaler Inhale 2 puffs into the lungs every 6 (six) hours as needed for wheezing or shortness of breath (Cough). 03/14/22   Lynden Oxford Scales, PA-C  atorvastatin (LIPITOR) 10 MG tablet Take 10 mg by mouth daily.    [provider]  benzonatate (TESSALON) 100 MG capsule Take 1 capsule (100 mg total) by mouth every 8 (eight) hours. 03/07/22   Lynden Oxford Scales, PA-C  cetirizine (ZYRTEC) 10 MG tablet Take 10 mg by mouth daily.    [provider]  promethazine-dextromethorphan (PROMETHAZINE-DM) 6.25-15 MG/5ML syrup Take 5 mLs by mouth 4 (four) times daily as needed for cough. 03/07/22   Lynden Oxford Scales, PA-C  SEMAGLUTIDE, 2 MG/DOSE, [place] Inject into the skin.    [provider]      Allergies    Fluconazole and Nifedipine    Review of Systems   Review of Systems  Constitutional:  Negative for chills, fatigue and fever.   Neurological:        +hypersensitivity of L leg    Physical Exam Updated Vital Signs BP (!) 120/58   Pulse 78   Temp 98.3 F (36.8 C)   Resp 16   Ht '5\' 4"'$  (1.626 m)   Wt 79.4 kg   SpO2 96%   BMI 30.04 kg/m  Physical Exam Vitals and nursing note reviewed.  Constitutional:      General: She is not in acute distress.    Appearance: She is well-developed.  HENT:     Head: Normocephalic and atraumatic.  Eyes:     General:        Right eye: No discharge.        Left eye: No discharge.     Conjunctiva/sclera: Conjunctivae normal.  Cardiovascular:     Rate and Rhythm: Normal rate and regular rhythm.     Pulses: Normal pulses.     Comments: No edema bilateral lower extremities. Pulmonary:     Effort: No respiratory distress.  Musculoskeletal:     Comments: Range of motion intact.  No pain with flexion, extension.  Skin:    Comments: No edema bilateral lower extremities.  Neurological:     Mental Status: She is alert.     Comments: Clear speech. +increased hypersensitivity of anterior thigh and posterior knee  Psychiatric:        Behavior: Behavior normal.  Thought Content: Thought content normal.     ED Results / Procedures / Treatments   Labs (all labs ordered are listed, but only abnormal results are displayed) Labs Reviewed  COMPREHENSIVE METABOLIC PANEL - Abnormal; Notable for the following components:      Result Value   Creatinine, Ser 1.11 (*)    GFR, Estimated 56 (*)    All other components within normal limits  CBC    EKG None  Radiology US Venous Img Lower Unilateral Left  Result Date: 07/21/2022 CLINICAL DATA:  red and swollen. Painful. Left anterior thigh and posterior calf. EXAM: Left LOWER EXTREMITY VENOUS DOPPLER ULTRASOUND TECHNIQUE: Gray-scale sonography with compression, as well as color and duplex ultrasound, were performed to evaluate the deep venous system(s) from the level of the common femoral vein through the popliteal and  proximal calf veins. COMPARISON:  None Available. FINDINGS: VENOUS Normal compressibility of the common femoral, superficial femoral, and popliteal veins, as well as the visualized calf veins. Visualized portions of profunda femoral vein and great saphenous vein unremarkable. No filling defects to suggest DVT on grayscale or color Doppler imaging. Doppler waveforms show normal direction of venous flow, normal respiratory plasticity and response to augmentation. Limited views of the contralateral common femoral vein are unremarkable. OTHER None. Limitations: none IMPRESSION: No deep venous thrombosis of the left lower extremity. Electronically Signed   By: Iven Finn M.D.   On: 07/21/2022 17:30    Procedures Procedures    Medications Ordered in ED Medications - No data to display  ED Course/ Medical Decision Making/ A&P                           Medical Decision Making Patient is here for left thigh/posterior knee pain for the last 4 days.  States that it is only painful when she touches it.  Has not tried anything for it.  Was sent from urgent care for an ultrasound.  On exam she is well-appearing, she has no difficulty with range of motion, she has no swelling, redness of her legs.  She only has increased hypersensitivity of the anterior thigh and posterior knee.   Amount and/or Complexity of Data Reviewed Labs: ordered.    Details: Labs within normal limits, no elevation of leukocytes. Radiology: ordered.    Details: No findings of blood clot on ultrasound. Discussion of management or test interpretation with external provider(s): Symptoms are significant for possible nerve pain.  She has no rash, erythema, swelling of the leg.  No findings of blood clot on ultrasound.  Discussed return precautions including development of rash, swelling of the leg, increased weakness.  Patient voiced understanding   Final Clinical Impression(s) / ED Diagnoses Final diagnoses:  Hypersensitivity,  initial encounter  Leg pain, left    Rx / DC Orders ED Discharge Orders     None         Diamantina Monks, Si Gaul, PA 07/21/22 Valerie Roys, MD 07/22/22 1132

## 2022-07-21 NOTE — ED Provider Notes (Signed)
HPI  SUBJECTIVE:  Kaitlyn Campos is a 60 y.o. female who presents with 2 issues:  Primary issue is 6 days of left anterior thigh and posterior left calf pain.  She describes the pain "like a sunburn", with tingling pins-and-needles sensation along the thigh.  She increased her physical activity at the gym over the past 2 weeks.  No rash, color changes, trauma to her back, hip, leg.  No calf swelling, chest pain, cough, wheeze, shortness of breath, hemoptysis, recent immobilization, surgery in the past 4 weeks, exogenous estrogen.  No back, hip, knee pain.  No pain with walking, going up and down stairs.  No numbness, tingling, leg weakness, fevers.  She tried massage with some improvement in her symptoms.  Symptoms are worse with palpation.  She has a past medical history of right breast cancer 14 years ago, has been cancer free since, varicella.  She got the shingles vaccine.  No history of DVT, PE, diabetes, hypertension, chronic kidney disease.   Second, she reports a sore throat starting last night.  Her husband has been sick with a URI for the past week, but has not been tested for flu or COVID.  No body aches, headaches, fevers, nasal congestion, rhinorrhea, postnasal drip, cough, shortness of breath, nausea, vomiting, diarrhea, abdominal pain.  No GERD or allergy symptoms.  No known COVID or flu exposure.  No aggravating or alleviating factors.  She has not tried anything for this.  She got 3 doses of the COVID-vaccine.  She has not yet gotten this years flu vaccine.  She is concerned because she is going to [place] in several days, and does not want to get anybody else sick.    Past Medical History:  Diagnosis Date   Allergy    Cancer [facility])    breast cancer  right     Past Surgical History:  Procedure Laterality Date   APPENDECTOMY     BREAST LUMPECTOMY Right    LAPAROSCOPIC REPAIR AND REMOVAL OF GASTRIC BAND  2010   PLACEMENT OF BREAST IMPLANTS Bilateral 2001   REPLACEMENT TOTAL  KNEE Right 2018   TONSILECTOMY, ADENOIDECTOMY, BILATERAL MYRINGOTOMY AND TUBES     TUBAL LIGATION      Family History  Problem Relation Age of Onset   Esophageal cancer Father    Colon cancer Neg Hx    Rectal cancer Neg Hx    Stomach cancer Neg Hx     Social History   Tobacco Use   Smoking status: Never   Smokeless tobacco: Never  Substance Use Topics   Alcohol use: Never   Drug use: Never    No current facility-administered medications for this encounter.  Current Outpatient Medications:    SEMAGLUTIDE, 2 MG/DOSE, [place], Inject into the skin., Disp: , Rfl:    albuterol (VENTOLIN HFA) 108 (90 Base) MCG/ACT inhaler, Inhale 2 puffs into the lungs every 6 (six) hours as needed for wheezing or shortness of breath (Cough)., Disp: 18 g, Rfl: 0   atorvastatin (LIPITOR) 10 MG tablet, Take 10 mg by mouth daily., Disp: , Rfl:    benzonatate (TESSALON) 100 MG capsule, Take 1 capsule (100 mg total) by mouth every 8 (eight) hours., Disp: 21 capsule, Rfl: 0   cetirizine (ZYRTEC) 10 MG tablet, Take 10 mg by mouth daily., Disp: , Rfl:    promethazine-dextromethorphan (PROMETHAZINE-DM) 6.25-15 MG/5ML syrup, Take 5 mLs by mouth 4 (four) times daily as needed for cough., Disp: 180 mL, Rfl: 0  Allergies  Allergen  Reactions   Fluconazole Hives   Nifedipine      ROS  As noted in HPI.   Physical Exam  BP 113/78 (BP Location: Left Arm)   Pulse 65   Temp 97.8 F (36.6 C) (Oral)   Resp 18   SpO2 99%   Constitutional: Well developed, well nourished, no acute distress Eyes:  EOMI, conjunctiva normal bilaterally HENT: Normocephalic, atraumatic,mucus membranes moist.  Slightly erythematous oropharynx.  Tonsils surgically absent.  Uvula midline. Neck: No cervical lymphadenopathy Respiratory: Normal inspiratory effort Cardiovascular: Normal rate GI: nondistended skin: No rash over entire left leg, skin intact Musculoskeletal: Tenderness anterior left thigh, medial thigh along saphenous  vein.  Positive tenderness along the calf along the deep venous system, positive popliteal tenderness.  No appreciable cord.  No tenderness along the gastrox. no edema.  No tenderness over the back, buttocks, hamstrings.  No pain with range of motion of the hip or knee.  Hip, knee exam normal, stable, no bony tenderness.  DP 2+. Neurologic: Alert & oriented x 3, no focal neuro deficits Psychiatric: Speech and behavior appropriate   ED Course   Medications - No data to display  Orders Placed This Encounter  Procedures   Resp Panel by RT-PCR (Flu A&B, Covid) Anterior Nasal Swab    Standing Status:   Standing    Number of Occurrences:   1    No results found for this or any previous visit (from the past 24 hour(s)). No results found.  ED Clinical Impression  1. Left leg pain   2. Sore throat   3. Encounter for laboratory testing for COVID-19 virus      ED Assessment/Plan      1.  Left lower extremity pain.  She has tenderness along the deep venous system which is concerning.  However the anterior thigh pain does not particularly fit with a diagnosis of DVT.  She has no calf swelling, edema.  There is no rash, I would expect that a shingles rash to have shown up by now.  I would also expect musculoskeletal tenderness to be more achy, present with walking/use.  She does not have any back, hip, knee pain or tenderness with palpation or range of motion.  However, I do not have an alternative diagnosis that is more likely.  Wells score 1.  I do not have access to D-dimer testing or ultrasound, will advise patient to proceed to the ED to have this done.  He is stable to go by private vehicle.  2.  Sore throat.  Symptoms started last night.  She is going to [place] in few days, so will check COVID and flu.  Supportive treatment including Benadryl/Maalox mixture 3-4 times a day, warm liquids, Mucinex D if she starts to have nasal congestion, Flonase for postnasal drip.  Discussed MDM,  treatment plan, and plan for follow-up with patient.  patient agrees with plan.   No orders of the defined types were placed in this encounter.     *This clinic note was created using Dragon dictation software. Therefore, there may be occasional mistakes despite careful proofreading.  ?    Melynda Ripple, MD 07/21/22 1506

## 2022-07-21 NOTE — Discharge Instructions (Addendum)
I am concerned that she may have a blood clot in your leg.  Please go to the [facility] emergency department right now to have that ruled out.  We will contact you if your COVID or flu come back positive.     Your COVID and flu testing will be back in 6 to 24 hours.  We will contact you if either one of them come back positive.  Make sure you drink plenty of extra fluids.  Some people find salt water gargles and  Tr[employer]'s "Throat Coat" tea helpful. Take 5 mL of liquid Benadryl and 5 mL of Maalox. Mix it together, and then hold it in your mouth for as long as you can and then swallow. You may do this 4 times a day.  Mucinex D if you start to have nasal congestion and Flonase he started having nasal congestion/postnasal drip.

## 2022-07-21 NOTE — ED Triage Notes (Signed)
Pt also complains of sore throat.

## 2022-08-12 IMAGING — DX DG CHEST 2V
2 series · 2 of 2 positions shown · non-contrast
Comparison: None Available.

CLINICAL DATA: Productive cough for 1 week. Decreased breath sounds
in left lung.

EXAM:
CHEST - 2 VIEW

[chest pa]
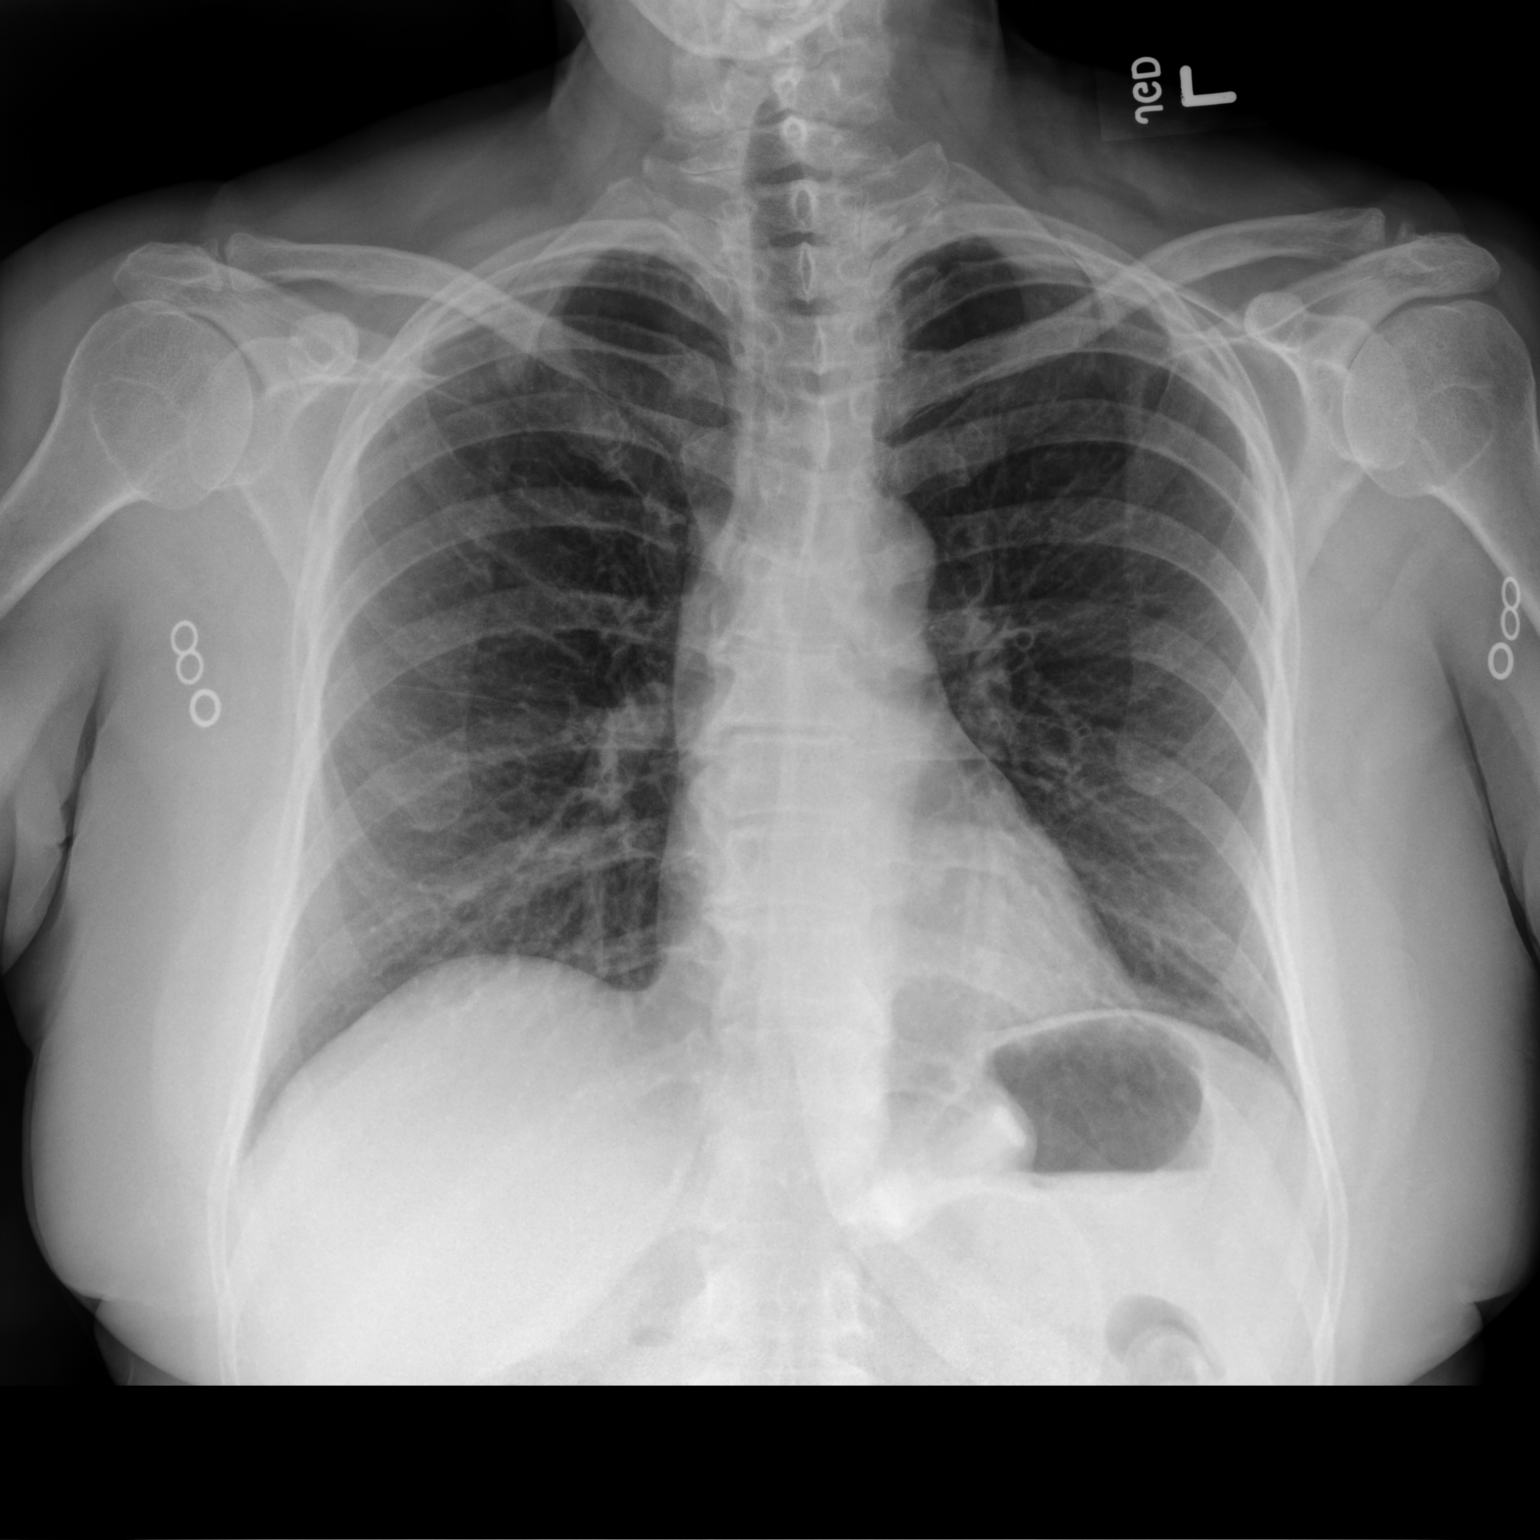

[chest lat]
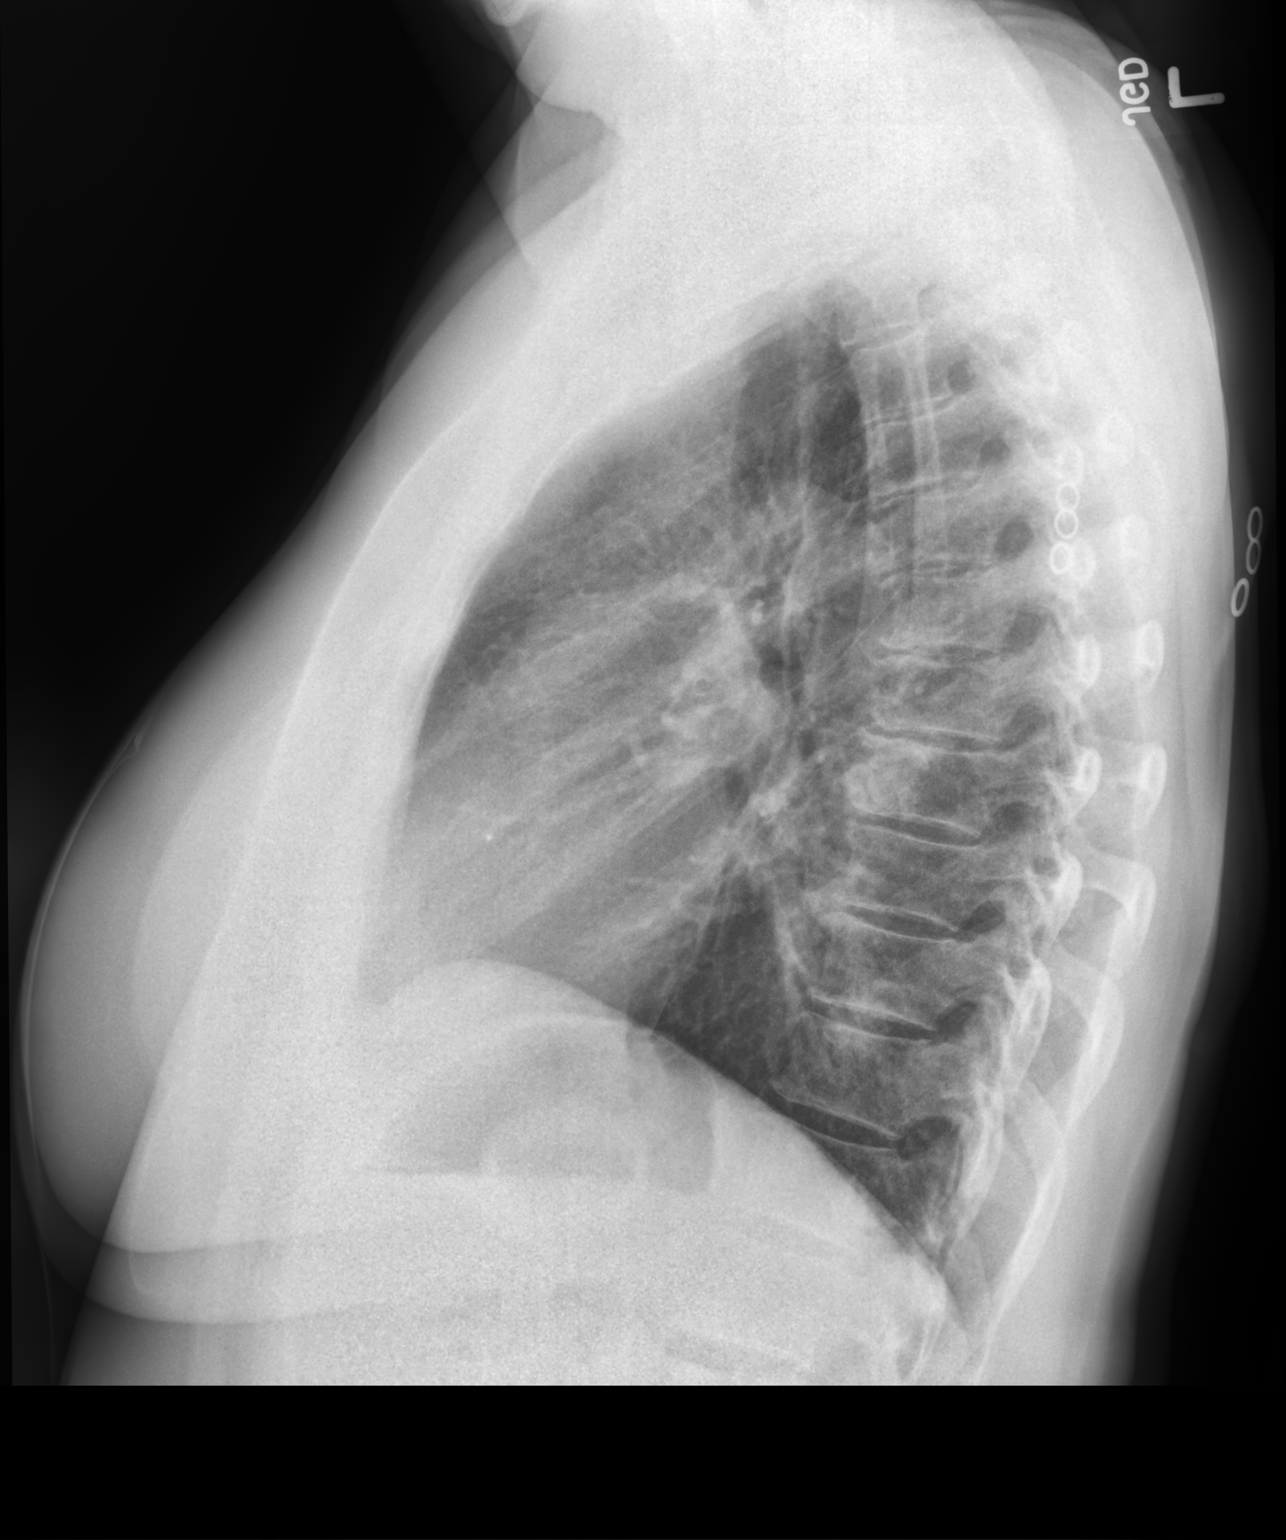

[2 of 2 positions shown; findings below may reference images not displayed]

FINDINGS: The heart size and mediastinal contours are within normal limits.
Both lungs are clear. Mild thoracic spine degenerative disc disease
incidentally noted.
IMPRESSION: No active cardiopulmonary disease.

## 2022-10-16 ENCOUNTER — Ambulatory Visit
Admission: RE | Admit: 2022-10-16 | Discharge: 2022-10-16 | Disposition: A | Discharge status: Home / Self Care | Payer: BC Managed Care – PPO | Source: Ambulatory Visit | Attending: Physician Assistant | Admitting: Physician Assistant

## 2022-10-16 VITALS — BP 120/81 | HR 85 | Temp 98.1°F | Resp 17

## 2022-10-16 DIAGNOSIS — N3000 Acute cystitis without hematuria: Secondary | ICD-10-CM | POA: Diagnosis present

## 2022-10-16 LAB — POCT URINALYSIS DIP (MANUAL ENTRY)
Bilirubin, UA: NEGATIVE
Glucose, UA: NEGATIVE mg/dL
Ketones, POC UA: NEGATIVE mg/dL
Nitrite, UA: NEGATIVE
Protein Ur, POC: NEGATIVE mg/dL
Spec Grav, UA: 1.015 (ref 1.010–1.025)
Urobilinogen, UA: 0.2 E.U./dL
pH, UA: 5 (ref 5.0–8.0)

## 2022-10-16 MED ORDER — CIPROFLOXACIN HCL 500 MG PO TABS: 500.0000 mg | ORAL_TABLET | Freq: Two times a day (BID) | ORAL | 0 refills | Status: DC

## 2022-10-16 NOTE — ED Provider Notes (Signed)
[facility]    CSN: [id number] Arrival date & time: 10/16/22  1451      History   Chief Complaint Chief Complaint  Patient presents with   Urinary Frequency    Maybe UTI . ?? - Entered by patient    HPI Kaitlyn Campos is a 62 y.o. female.   Patient here today for evaluation of strong urine odor that started yesterday. She denies any vomiting or diarrhea. She has not had any abdominal or back pain. She does not report treatment for symptoms.  The history is provided by the patient.  Urinary Frequency Pertinent negatives include no chest pain, no abdominal pain and no shortness of breath.    Past Medical History:  Diagnosis Date   Allergy    Cancer [facility])    breast cancer  right     There are no problems to display for this patient.   Past Surgical History:  Procedure Laterality Date   APPENDECTOMY     BREAST LUMPECTOMY Right    LAPAROSCOPIC REPAIR AND REMOVAL OF GASTRIC BAND  2010   PLACEMENT OF BREAST IMPLANTS Bilateral 2001   REPLACEMENT TOTAL KNEE Right 2018   TONSILECTOMY, ADENOIDECTOMY, BILATERAL MYRINGOTOMY AND TUBES     TUBAL LIGATION      OB History   No obstetric history on file.      Home Medications    Prior to Admission medications   Medication Sig Start Date End Date Taking? Authorizing Provider  ciprofloxacin (CIPRO) 500 MG tablet Take 1 tablet (500 mg total) by mouth every 12 (twelve) hours. 10/16/22  Yes Francene Finders, PA-C  albuterol (VENTOLIN HFA) 108 (90 Base) MCG/ACT inhaler Inhale 2 puffs into the lungs every 6 (six) hours as needed for wheezing or shortness of breath (Cough). 03/14/22   Lynden Oxford Scales, PA-C  atorvastatin (LIPITOR) 10 MG tablet Take 10 mg by mouth daily.    [provider]  benzonatate (TESSALON) 100 MG capsule Take 1 capsule (100 mg total) by mouth every 8 (eight) hours. 03/07/22   Lynden Oxford Scales, PA-C  cetirizine (ZYRTEC) 10 MG tablet Take 10 mg by mouth daily.    [provider]  promethazine-dextromethorphan (PROMETHAZINE-DM) 6.25-15 MG/5ML syrup Take 5 mLs by mouth 4 (four) times daily as needed for cough. 03/07/22   Lynden Oxford Scales, PA-C  SEMAGLUTIDE, 2 MG/DOSE, [place] Inject into the skin.    [provider]    Family History Family History  Problem Relation Age of Onset   Esophageal cancer Father    Colon cancer Neg Hx    Rectal cancer Neg Hx    Stomach cancer Neg Hx     Social History Social History   Tobacco Use   Smoking status: Never   Smokeless tobacco: Never  Vaping Use   Vaping Use: Never used  Substance Use Topics   Alcohol use: Never   Drug use: Never     Allergies   Fluconazole and Nifedipine   Review of Systems Review of Systems  Constitutional:  Negative for chills and fever.  Respiratory:  Negative for shortness of breath.   Cardiovascular:  Negative for chest pain.  Gastrointestinal:  Negative for abdominal pain, nausea and vomiting.  Genitourinary:  Positive for frequency. Negative for dysuria.  Musculoskeletal:  Negative for back pain.     Physical Exam Triage Vital Signs ED Triage Vitals  Enc Vitals Group     BP      Pulse  Resp      Temp      Temp src      SpO2      Weight      Height      Head Circumference      Peak Flow      Pain Score      Pain Loc      Pain Edu?      Excl. in [place]?    No data found.  Updated Vital Signs BP 120/81 (BP Location: Left Arm)   Pulse 85   Temp 98.1 F (36.7 C) (Oral)   Resp 17   SpO2 94%       Physical Exam Vitals and nursing note reviewed.  Constitutional:      General: She is not in acute distress.    Appearance: Normal appearance. She is not ill-appearing.  HENT:     Head: Normocephalic and atraumatic.     Nose: Nose normal.  Cardiovascular:     Rate and Rhythm: Normal rate.  Pulmonary:     Effort: Pulmonary effort is normal. No respiratory distress.  Skin:    General: Skin is warm and dry.  Neurological:     Mental  Status: She is alert.  Psychiatric:        Mood and Affect: Mood normal.        Thought Content: Thought content normal.      UC Treatments / Results  Labs (all labs ordered are listed, but only abnormal results are displayed) Labs Reviewed  POCT URINALYSIS DIP (MANUAL ENTRY) - Abnormal; Notable for the following components:      Result Value   Clarity, UA hazy (*)    Blood, UA trace-intact (*)    Leukocytes, UA Small (1+) (*)    All other components within normal limits  URINE CULTURE    EKG   Radiology No results found.  Procedures Procedures (including critical care time)  Medications Ordered in UC Medications - No data to display  Initial Impression / Assessment and Plan / UC Course  I have reviewed the triage vital signs and the nursing notes.  Pertinent labs & imaging results that were available during my care of the patient were reviewed by me and considered in my medical decision making (see chart for details).    UA with positive LE. Will treat to cover UTI and urine culture ordered. Recommended follow up if no gradual improvement or with any worsening symptoms.   Final Clinical Impressions(s) / UC Diagnoses   Final diagnoses:  Acute cystitis without hematuria   Discharge Instructions   None    ED Prescriptions     Medication Sig Dispense Auth. Provider   ciprofloxacin (CIPRO) 500 MG tablet Take 1 tablet (500 mg total) by mouth every 12 (twelve) hours. 10 tablet Francene Finders, PA-C      PDMP not reviewed this encounter.   Francene Finders, PA-C 10/16/22 1558

## 2022-10-16 NOTE — ED Triage Notes (Signed)
Pt presents with strong urine odor since yesterday.

## 2022-10-18 LAB — URINE CULTURE: Culture: 20000 — AB

## 2022-12-10 ENCOUNTER — Ambulatory Visit (INDEPENDENT_AMBULATORY_CARE_PROVIDER_SITE_OTHER): Payer: BC Managed Care – PPO

## 2022-12-10 ENCOUNTER — Ambulatory Visit
Admission: RE | Admit: 2022-12-10 | Discharge: 2022-12-10 | Disposition: A | Discharge status: Home / Self Care | Payer: BC Managed Care – PPO | Source: Ambulatory Visit | Attending: Internal Medicine | Admitting: Internal Medicine

## 2022-12-10 VITALS — BP 132/81 | HR 78 | Temp 98.2°F | Resp 16

## 2022-12-10 DIAGNOSIS — R059 Cough, unspecified: Secondary | ICD-10-CM | POA: Diagnosis not present

## 2022-12-10 DIAGNOSIS — R051 Acute cough: Secondary | ICD-10-CM | POA: Diagnosis not present

## 2022-12-10 MED ORDER — PROMETHAZINE-DM 6.25-15 MG/5ML PO SYRP: 5.0000 mL | ORAL_SOLUTION | Freq: Every evening | ORAL | 0 refills | Status: DC | PRN

## 2022-12-10 MED ORDER — BENZONATATE 100 MG PO CAPS: 100.0000 mg | ORAL_CAPSULE | Freq: Three times a day (TID) | ORAL | 0 refills | Status: DC | PRN

## 2022-12-10 NOTE — Discharge Instructions (Signed)
X-ray was normal.  I have prescribed 2 cough medications.  Suspect viral bronchitis.  Please be advised that Promethazine DM can cause drowsiness.  Follow-up if any symptoms persist or worsen.

## 2022-12-10 NOTE — ED Provider Notes (Signed)
[facility]    CSN: [id number] Arrival date & time: 12/10/22  1818      History   Chief Complaint Chief Complaint  Patient presents with   Cough    Cough and chest congestion coughing up some green phlegm - Entered by patient    HPI Kaitlyn Campos is a 62 y.o. female.   Patient presents with productive cough with green sputum that has been present for about 1 week.  Reports her grandchildren have had similar cough.  Denies any associated upper respiratory symptoms or fever.  Reports history of bronchitis chest x-ray was negative for any acute and pneumonia so she is concerned for this.  Has been taking Mucinex with no improvement.  Patient does not smoke cigarettes.  Denies chest pain or shortness of breath.   Cough   Past Medical History:  Diagnosis Date   Allergy    Cancer [facility])    breast cancer  right     There are no problems to display for this patient.   Past Surgical History:  Procedure Laterality Date   APPENDECTOMY     BREAST LUMPECTOMY Right    LAPAROSCOPIC REPAIR AND REMOVAL OF GASTRIC BAND  2010   PLACEMENT OF BREAST IMPLANTS Bilateral 2001   REPLACEMENT TOTAL KNEE Right 2018   TONSILECTOMY, ADENOIDECTOMY, BILATERAL MYRINGOTOMY AND TUBES     TUBAL LIGATION      OB History   No obstetric history on file.      Home Medications    Prior to Admission medications   Medication Sig Start Date End Date Taking? Authorizing Provider  benzonatate (TESSALON) 100 MG capsule Take 1 capsule (100 mg total) by mouth every 8 (eight) hours as needed for cough. 12/10/22  Yes Kamareon Sciandra, Michele Rockers, FNP  promethazine-dextromethorphan (PROMETHAZINE-DM) 6.25-15 MG/5ML syrup Take 5 mLs by mouth at bedtime as needed for cough. 12/10/22  Yes Misty Foutz, Michele Rockers, FNP  albuterol (VENTOLIN HFA) 108 (90 Base) MCG/ACT inhaler Inhale 2 puffs into the lungs every 6 (six) hours as needed for wheezing or shortness of breath (Cough). 03/14/22   Lynden Oxford Scales, PA-C   atorvastatin (LIPITOR) 10 MG tablet Take 10 mg by mouth daily.    [provider]  cetirizine (ZYRTEC) 10 MG tablet Take 10 mg by mouth daily.    [provider]  ciprofloxacin (CIPRO) 500 MG tablet Take 1 tablet (500 mg total) by mouth every 12 (twelve) hours. 10/16/22   Francene Finders, PA-C  SEMAGLUTIDE, 2 MG/DOSE, [place] Inject into the skin.    [provider]    Family History Family History  Problem Relation Age of Onset   Esophageal cancer Father    Colon cancer Neg Hx    Rectal cancer Neg Hx    Stomach cancer Neg Hx     Social History Social History   Tobacco Use   Smoking status: Never   Smokeless tobacco: Never  Vaping Use   Vaping Use: Never used  Substance Use Topics   Alcohol use: Never   Drug use: Never     Allergies   Fluconazole and Nifedipine   Review of Systems Review of Systems Per HPI  Physical Exam Triage Vital Signs ED Triage Vitals [12/10/22 1845]  Enc Vitals Group     BP 132/81     Pulse Rate 78     Resp 16     Temp 98.2 F (36.8 C)     Temp Source Oral  SpO2 99 %     Weight      Height      Head Circumference      Peak Flow      Pain Score 0     Pain Loc      Pain Edu?      Excl. in [place]?    No data found.  Updated Vital Signs BP 132/81 (BP Location: Left Arm)   Pulse 78   Temp 98.2 F (36.8 C) (Oral)   Resp 16   SpO2 99%   Visual Acuity Right Eye Distance:   Left Eye Distance:   Bilateral Distance:    Right Eye Near:   Left Eye Near:    Bilateral Near:     Physical Exam Constitutional:      General: She is not in acute distress.    Appearance: Normal appearance. She is not toxic-appearing or diaphoretic.  HENT:     Head: Normocephalic and atraumatic.     Right Ear: Tympanic membrane and ear canal normal.     Left Ear: Tympanic membrane and ear canal normal.     Nose: No congestion.     Mouth/Throat:     Mouth: Mucous membranes are moist.     Pharynx: No posterior oropharyngeal  erythema.  Eyes:     Extraocular Movements: Extraocular movements intact.     Conjunctiva/sclera: Conjunctivae normal.     Pupils: Pupils are equal, round, and reactive to light.  Cardiovascular:     Rate and Rhythm: Normal rate and regular rhythm.     Pulses: Normal pulses.     Heart sounds: Normal heart sounds.  Pulmonary:     Effort: Pulmonary effort is normal. No respiratory distress.     Breath sounds: Normal breath sounds. No stridor. No wheezing, rhonchi or rales.  Abdominal:     General: Abdomen is flat. Bowel sounds are normal.     Palpations: Abdomen is soft.  Musculoskeletal:        General: Normal range of motion.     Cervical back: Normal range of motion.  Skin:    General: Skin is warm and dry.  Neurological:     General: No focal deficit present.     Mental Status: She is alert and oriented to person, place, and time. Mental status is at baseline.  Psychiatric:        Mood and Affect: Mood normal.        Behavior: Behavior normal.      UC Treatments / Results  Labs (all labs ordered are listed, but only abnormal results are displayed) Labs Reviewed - No data to display  EKG   Radiology DG Chest 2 View  Result Date: 12/10/2022 CLINICAL DATA:  Cough EXAM: CHEST - 2 VIEW COMPARISON:  Chest x-ray dated March 14, 2022 FINDINGS: The heart size and mediastinal contours are within normal limits. Both lungs are clear. Gastric lap band device. The visualized skeletal structures are unremarkable. IMPRESSION: No active cardiopulmonary disease. Electronically Signed   By: Yetta Glassman M.D.   On: 12/10/2022 19:19    Procedures Procedures (including critical care time)  Medications Ordered in UC Medications - No data to display  Initial Impression / Assessment and Plan / UC Course  I have reviewed the triage vital signs and the nursing notes.  Pertinent labs & imaging results that were available during my care of the patient were reviewed by me and considered in  my medical decision making (see chart for  details).     Chest x-ray negative for any acute cardiopulmonary process.  No signs of bacterial infection on exam.  Suspect viral bronchitis.  Offered patient IM steroid but she declined.  Will treat with cough medication.  Advised patient that Promethazine DM can cause drowsiness.  Advised strict return precautions.  Patient verbalized understanding and was agreeable with plan. Final Clinical Impressions(s) / UC Diagnoses   Final diagnoses:  Acute cough     Discharge Instructions      X-ray was normal.  I have prescribed 2 cough medications.  Suspect viral bronchitis.  Please be advised that Promethazine DM can cause drowsiness.  Follow-up if any symptoms persist or worsen.     ED Prescriptions     Medication Sig Dispense Auth. Provider   benzonatate (TESSALON) 100 MG capsule Take 1 capsule (100 mg total) by mouth every 8 (eight) hours as needed for cough. 21 capsule [place], [place], [place]   promethazine-dextromethorphan (PROMETHAZINE-DM) 6.25-15 MG/5ML syrup Take 5 mLs by mouth at bedtime as needed for cough. 118 mL Teodora Medici, [place]      PDMP not reviewed this encounter.   Teodora Medici,  12/10/22 1929

## 2022-12-10 NOTE — ED Triage Notes (Signed)
Pt states cough for one week today started with green sputum. Has been taking Mucinex at home.

## 2022-12-13 ENCOUNTER — Ambulatory Visit
Admission: RE | Admit: 2022-12-13 | Discharge: 2022-12-13 | Disposition: A | Discharge status: Home / Self Care | Payer: BC Managed Care – PPO | Source: Ambulatory Visit | Attending: Internal Medicine | Admitting: Internal Medicine

## 2022-12-13 ENCOUNTER — Other Ambulatory Visit: Payer: Self-pay

## 2022-12-13 VITALS — BP 123/71 | HR 87 | Temp 97.9°F | Resp 18

## 2022-12-13 DIAGNOSIS — J209 Acute bronchitis, unspecified: Secondary | ICD-10-CM

## 2022-12-13 MED ORDER — PREDNISONE 20 MG PO TABS: 40.0000 mg | ORAL_TABLET | Freq: Every day | ORAL | 0 refills | Status: DC

## 2022-12-13 MED ORDER — ALBUTEROL SULFATE HFA 108 (90 BASE) MCG/ACT IN AERS: 1.0000 | INHALATION_SPRAY | Freq: Four times a day (QID) | RESPIRATORY_TRACT | 0 refills | Status: AC | PRN

## 2022-12-13 NOTE — ED Triage Notes (Signed)
Pt here for continued cough since last visit; pt sts meds not helping

## 2022-12-13 NOTE — Discharge Instructions (Addendum)
Please continue Mucinex, Tessalon Per[place] and Promethazine DM Please take prednisone in the morning Maintain adequate hydration Humidifier with vapor rub use will help with nasal congestion and cough Please return to urgent care if you have worsening symptoms.

## 2022-12-13 NOTE — ED Provider Notes (Signed)
[facility]    CSN: [id number] Arrival date & time: 12/13/22  1329      History   Chief Complaint Chief Complaint  Patient presents with   Cough    Still have chest congestion , coughing and night , cent sleep from cough . Feeling weak .My past history shows that , when I'm coughing up this green . I've had to take an antibiotic .I feel drained and tired . - Entered by patient    HPI Kaitlyn Campos is a 62 y.o. female comes to the urgent care for persistent cough productive of greenish sputum.  Patient was seen in the urgent care on 3/14 for similar symptoms.  Chest x-ray was negative for pneumonia at that time.  Patient was prescribed antitussives.  Prednisone was offered but the patient declined.  No fever or chills.  No shortness of breath or chest pain.  Patient endorses intermittent wheezing.  No history of tobacco use.  No history of toxic inhalational exposures.  Patient denies any dizziness, near syncope or syncopal episodes.  She has a humidifier but has not been using it. HPI  Past Medical History:  Diagnosis Date   Allergy    Cancer ([place])    breast cancer  right     There are no problems to display for this patient.   Past Surgical History:  Procedure Laterality Date   APPENDECTOMY     BREAST LUMPECTOMY Right    LAPAROSCOPIC REPAIR AND REMOVAL OF GASTRIC BAND  2010   PLACEMENT OF BREAST IMPLANTS Bilateral 2001   REPLACEMENT TOTAL KNEE Right 2018   TONSILECTOMY, ADENOIDECTOMY, BILATERAL MYRINGOTOMY AND TUBES     TUBAL LIGATION      OB History   No obstetric history on file.      Home Medications    Prior to Admission medications   Medication Sig Start Date End Date Taking? Authorizing Provider  albuterol (VENTOLIN HFA) 108 (90 Base) MCG/ACT inhaler Inhale 1 puff into the lungs every 6 (six) hours as needed for wheezing or shortness of breath. 12/13/22  Yes Senetra Dillin, Myrene Galas, MD  predniSONE (DELTASONE) 20 MG tablet Take 2 tablets (40 mg total)  by mouth daily. 12/13/22  Yes Grete Bosko, Myrene Galas, MD  atorvastatin (LIPITOR) 10 MG tablet Take 10 mg by mouth daily.    [provider]  benzonatate (TESSALON) 100 MG capsule Take 1 capsule (100 mg total) by mouth every 8 (eight) hours as needed for cough. 12/10/22   Teodora Medici, FNP  cetirizine (ZYRTEC) 10 MG tablet Take 10 mg by mouth daily.    [provider]  ciprofloxacin (CIPRO) 500 MG tablet Take 1 tablet (500 mg total) by mouth every 12 (twelve) hours. Patient not taking: Reported on 12/13/2022 10/16/22   Francene Finders, PA-C  promethazine-dextromethorphan (PROMETHAZINE-DM) 6.25-15 MG/5ML syrup Take 5 mLs by mouth at bedtime as needed for cough. 12/10/22   Teodora Medici, FNP  SEMAGLUTIDE, 2 MG/DOSE, [place] Inject into the skin.    [provider]    Family History Family History  Problem Relation Age of Onset   Esophageal cancer Father    Colon cancer Neg Hx    Rectal cancer Neg Hx    Stomach cancer Neg Hx     Social History Social History   Tobacco Use   Smoking status: Never   Smokeless tobacco: Never  Vaping Use   Vaping Use: Never used  Substance Use Topics   Alcohol use: Never  Drug use: Never     Allergies   Fluconazole and Nifedipine   Review of Systems Review of Systems As per HPI  Physical Exam Triage Vital Signs ED Triage Vitals [12/13/22 1349]  Enc Vitals Group     BP 123/71     Pulse Rate 87     Resp 18     Temp 97.9 F (36.6 C)     Temp Source Oral     SpO2 94 %     Weight      Height      Head Circumference      Peak Flow      Pain Score 0     Pain Loc      Pain Edu?      Excl. in [place]?    No data found.  Updated Vital Signs BP 123/71 (BP Location: Left Arm)   Pulse 87   Temp 97.9 F (36.6 C) (Oral)   Resp 18   SpO2 94%   Visual Acuity Right Eye Distance:   Left Eye Distance:   Bilateral Distance:    Right Eye Near:   Left Eye Near:    Bilateral Near:     Physical Exam Vitals and nursing note  reviewed.  Constitutional:      Appearance: Normal appearance.  HENT:     Left Ear: Tympanic membrane normal.     Mouth/Throat:     Pharynx: No posterior oropharyngeal erythema.  Cardiovascular:     Rate and Rhythm: Normal rate and regular rhythm.     Pulses: Normal pulses.     Heart sounds: Normal heart sounds.  Pulmonary:     Effort: Pulmonary effort is normal.     Breath sounds: Wheezing present.  Abdominal:     General: Bowel sounds are normal.     Palpations: Abdomen is soft.  Neurological:     Mental Status: She is alert.      UC Treatments / Results  Labs (all labs ordered are listed, but only abnormal results are displayed) Labs Reviewed - No data to display  EKG   Radiology No results found.  Procedures Procedures (including critical care time)  Medications Ordered in UC Medications - No data to display  Initial Impression / Assessment and Plan / UC Course  I have reviewed the triage vital signs and the nursing notes.  Pertinent labs & imaging results that were available during my care of the patient were reviewed by me and considered in my medical decision making (see chart for details).     1.  Acute bronchitis with bronchospasm: Albuterol inhaler for 4 to 6 hours as needed Prednisone 40 mg orally daily-patient advised to take medication in the morning Lung exam is reassuring-no chest x-ray indicated at this time Humidifier and VapoRub use will help with patient's symptoms Return precautions given. Final Clinical Impressions(s) / UC Diagnoses   Final diagnoses:  Acute bronchitis with bronchospasm     Discharge Instructions      Please continue Mucinex, Tessalon Perles and Promethazine DM Please take prednisone in the morning Maintain adequate hydration Humidifier with vapor rub use will help with nasal congestion and cough Please return to urgent care if you have worsening symptoms.   ED Prescriptions     Medication Sig Dispense Auth.  Provider   albuterol (VENTOLIN HFA) 108 (90 Base) MCG/ACT inhaler Inhale 1 puff into the lungs every 6 (six) hours as needed for wheezing or shortness of breath. 6.7 g Jamse Arn  O, MD   predniSONE (DELTASONE) 20 MG tablet Take 2 tablets (40 mg total) by mouth daily. 10 tablet Jarid Sasso, Myrene Galas, MD      PDMP not reviewed this encounter.   Chase Picket, MD 12/13/22 [phone]

## 2023-05-25 ENCOUNTER — Ambulatory Visit
Admission: RE | Admit: 2023-05-25 | Discharge: 2023-05-25 | Disposition: A | Discharge status: Home / Self Care | Payer: BC Managed Care – PPO | Source: Ambulatory Visit | Attending: Emergency Medicine | Admitting: Emergency Medicine

## 2023-05-25 VITALS — BP 113/76 | HR 86 | Temp 98.5°F | Resp 18 | Ht 66.0 in | Wt 165.0 lb

## 2023-05-25 DIAGNOSIS — J340 Abscess, furuncle and carbuncle of nose: Secondary | ICD-10-CM | POA: Diagnosis not present

## 2023-05-25 MED ORDER — NAPROXEN 500 MG PO TABS: 500.0000 mg | ORAL_TABLET | Freq: Two times a day (BID) | ORAL | 0 refills | Status: DC

## 2023-05-25 MED ORDER — CEPHALEXIN 500 MG PO CAPS: 1000.0000 mg | ORAL_CAPSULE | Freq: Two times a day (BID) | ORAL | 0 refills | Status: AC

## 2023-05-25 MED ORDER — DOXYCYCLINE HYCLATE 100 MG PO CAPS: 100.0000 mg | ORAL_CAPSULE | Freq: Two times a day (BID) | ORAL | 0 refills | Status: AC

## 2023-05-25 NOTE — ED Triage Notes (Signed)
I have sore / infection in my nose . Painful , red , and swelling . Left side . - Entered by patient   Additional Information: "a lot of nasal/sinus pressure and pain". Started "Sunday evening". No fever. No cough. No "runny" nose.

## 2023-05-25 NOTE — Discharge Instructions (Signed)
Finish the antibiotics, even if you feel better.  Warm compresses.  Take Naprosyn with 1000 mg of Tylenol twice a day.  Follow-up with your primary care provider if you are not getting any better, go to the ER if you get worse.

## 2023-05-25 NOTE — ED Provider Notes (Signed)
HPI  SUBJECTIVE:  Kaitlyn Campos is a 62 y.o. female who presents with a painful, erythematous left nare starting 2 days ago.  She states that the erythema is spreading up her nasal bridge.  She describes the pain as throbbing.  No trauma to the nose, sores inside of her nose, fevers, body aches, epistaxis, nasal congestion.  She had a viral illness last week with diarrhea, vomiting and nausea, but this has since resolved.  She has been applying Bactroban inside her left nare without improvement in her symptoms.  No alleviating factors.  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  No history of diabetes.  PCP: Dr. Cephus Shelling    Past Medical History:  Diagnosis Date   Allergy    Cancer [facility])    breast cancer  right     Past Surgical History:  Procedure Laterality Date   APPENDECTOMY     BREAST LUMPECTOMY Right    LAPAROSCOPIC REPAIR AND REMOVAL OF GASTRIC BAND  2010   PLACEMENT OF BREAST IMPLANTS Bilateral 2001   REPLACEMENT TOTAL KNEE Right 2018   TONSILECTOMY, ADENOIDECTOMY, BILATERAL MYRINGOTOMY AND TUBES     TUBAL LIGATION      Family History  Problem Relation Age of Onset   Esophageal cancer Father    Colon cancer Neg Hx    Rectal cancer Neg Hx    Stomach cancer Neg Hx     Social History   Tobacco Use   Smoking status: Never   Smokeless tobacco: Never  Vaping Use   Vaping status: Never Used  Substance Use Topics   Alcohol use: Never   Drug use: Never    No current facility-administered medications for this encounter.  Current Outpatient Medications:    atorvastatin (LIPITOR) 10 MG tablet, Take 10 mg by mouth daily., Disp: , Rfl:    cephALEXin (KEFLEX) 500 MG capsule, Take 2 capsules (1,000 mg total) by mouth 2 (two) times daily for 7 days., Disp: 28 capsule, Rfl: 0   cetirizine (ZYRTEC) 10 MG tablet, Take 10 mg by mouth daily., Disp: , Rfl:    doxycycline (VIBRAMYCIN) 100 MG capsule, Take 1 capsule (100 mg total) by mouth 2  (two) times daily for 7 days., Disp: 14 capsule, Rfl: 0   mupirocin cream (BACTROBAN) 2 %, Apply 1 Application topically 2 (two) times daily., Disp: , Rfl:    naproxen (NAPROSYN) 500 MG tablet, Take 1 tablet (500 mg total) by mouth 2 (two) times daily., Disp: 20 tablet, Rfl: 0   albuterol (VENTOLIN HFA) 108 (90 Base) MCG/ACT inhaler, Inhale 1 puff into the lungs every 6 (six) hours as needed for wheezing or shortness of breath., Disp: 6.7 g, Rfl: 0   SEMAGLUTIDE, 2 MG/DOSE, [place], Inject into the skin., Disp: , Rfl:   Allergies  Allergen Reactions   Fluconazole Hives    Other Reaction(s): Rash   Nifedipine     Other Reaction(s): Swelling In Feet, Unknown     ROS  As noted in HPI.   Physical Exam  BP 113/76 (BP Location: Left Arm)   Pulse 86   Temp 98.5 F (36.9 C) (Oral)   Resp 18   Ht 5\' 6"  (1.676 m)   Wt 74.8 kg   SpO2 97%   BMI 26.63 kg/m   Constitutional: Well developed, well nourished, no acute distress Eyes:  EOMI, conjunctiva normal bilaterally HENT: Normocephalic, atraumatic,mucus membranes moist.  Tender erythema, edema left nare.  Some tenderness, swelling going up the nasal bridge.  No appreciable mass, pustule when gently squeezing the nose.  No appreciable lesions inside the nose.  No mass seen on transillumination.      Respiratory: Normal inspiratory effort Cardiovascular: Normal rate GI: nondistended skin: No rash, skin intact Musculoskeletal: no deformities Neurologic: Alert & oriented x 3, no focal neuro deficits Psychiatric: Speech and behavior appropriate   ED Course   Medications - No data to display  No orders of the defined types were placed in this encounter.   No results found for this or any previous visit (from the past 24 hour(s)). No results found.  ED Clinical Impression  1. Cellulitis of external nose      ED Assessment/Plan     Presentation consistent with a cellulitis of the nose.  Could also be an infected ingrown  hair.  Does not appear to be an abscess at this time.  Will send home with 7 days of doxycycline and Keflex.  Warm compresses.  Naprosyn/Tylenol.  May continue using the Bactroban.  Follow-up with PCP if not getting any better.  She is to go to the ER if it gets significantly worse, fevers, or for other concerns.   Discussed MDM, treatment plan, and plan for follow-up with patient. Discussed sn/sx that should prompt return to the ED. patient agrees with plan.   Meds ordered this encounter  Medications   cephALEXin (KEFLEX) 500 MG capsule    Sig: Take 2 capsules (1,000 mg total) by mouth 2 (two) times daily for 7 days.    Dispense:  28 capsule    Refill:  0   doxycycline (VIBRAMYCIN) 100 MG capsule    Sig: Take 1 capsule (100 mg total) by mouth 2 (two) times daily for 7 days.    Dispense:  14 capsule    Refill:  0   naproxen (NAPROSYN) 500 MG tablet    Sig: Take 1 tablet (500 mg total) by mouth 2 (two) times daily.    Dispense:  20 tablet    Refill:  0      *This clinic note was created using Scientist, clinical (histocompatibility and immunogenetics). Therefore, there may be occasional mistakes despite careful proofreading.  ?    Domenick Gong, MD 05/26/23 1230

## 2023-07-15 DIAGNOSIS — B009 Herpesviral infection, unspecified: Secondary | ICD-10-CM | POA: Insufficient documentation

## 2023-07-15 DIAGNOSIS — R52 Pain, unspecified: Secondary | ICD-10-CM | POA: Insufficient documentation

## 2023-07-15 DIAGNOSIS — Z0000 Encounter for general adult medical examination without abnormal findings: Secondary | ICD-10-CM | POA: Insufficient documentation

## 2023-07-20 DIAGNOSIS — E785 Hyperlipidemia, unspecified: Secondary | ICD-10-CM | POA: Insufficient documentation

## 2023-11-19 ENCOUNTER — Other Ambulatory Visit: Payer: Self-pay | Admitting: General Surgery

## 2023-11-19 DIAGNOSIS — Z9884 Bariatric surgery status: Secondary | ICD-10-CM

## 2023-11-24 ENCOUNTER — Ambulatory Visit
Admission: RE | Admit: 2023-11-24 | Discharge: 2023-11-24 | Disposition: A | Discharge status: Home / Self Care | Payer: 59 | Source: Ambulatory Visit

## 2023-11-24 VITALS — BP 127/86 | HR 86 | Temp 97.5°F | Resp 18

## 2023-11-24 DIAGNOSIS — J3489 Other specified disorders of nose and nasal sinuses: Secondary | ICD-10-CM | POA: Diagnosis not present

## 2023-11-24 DIAGNOSIS — J0100 Acute maxillary sinusitis, unspecified: Secondary | ICD-10-CM

## 2023-11-24 DIAGNOSIS — J0120 Acute ethmoidal sinusitis, unspecified: Secondary | ICD-10-CM

## 2023-11-24 HISTORY — DX: Other seasonal allergic rhinitis: J30.2

## 2023-11-24 HISTORY — DX: Hyperlipidemia, unspecified: E78.5

## 2023-11-24 MED ORDER — AMOXICILLIN-POT CLAVULANATE 875-125 MG PO TABS: 1.0000 | ORAL_TABLET | Freq: Two times a day (BID) | ORAL | 0 refills | Status: AC

## 2023-11-24 NOTE — Discharge Instructions (Signed)
 Recommend start Augmentin 875mg  twice a day for 10 days- take with food. Continue to push fluids to help loosen up mucus in sinuses. Stop Mucinex for now. Continue Zyrtec and Singulair daily. Follow-up here in 5 to 7 days if not improving.

## 2023-11-24 NOTE — ED Provider Notes (Signed)
 [facility]    CSN: [id number] Arrival date & time: 11/24/23  1745      History   Chief Complaint Chief Complaint  Patient presents with   Fever    No fever, I can't get that off. I had sinus congestion and I get it for 5 or 6 days and it goes away only to come back a few days later this has been going on for2 months. - Entered by patient    HPI Kaitlyn Campos is a 63 y.o. female.   63 year old female presents with sinus pressure and congestion off and on for the past 2 months. Has been worse over the past week. Has been trying to take Mucinex severe cold and sinus medication with some relief but now experiencing sinus headache, pressure and drainage. Denies any fever, ear pain, sore throat or cough. (Checked "fever" by mistake when listing symptoms on intake but unable to remove fever from list). Has history of environmental allergies and takes Zyrtec and Singulair daily. Other chronic health issues include hyperlipidemia and currently on Lipitor daily.   The history is provided by the patient.    Past Medical History:  Diagnosis Date   Allergy    Cancer [facility])    breast cancer  right    Hyperlipidemia    Seasonal allergies     There are no active problems to display for this patient.   Past Surgical History:  Procedure Laterality Date   APPENDECTOMY     BREAST LUMPECTOMY Right    LAPAROSCOPIC REPAIR AND REMOVAL OF GASTRIC BAND  2010   PLACEMENT OF BREAST IMPLANTS Bilateral 2001   REPLACEMENT TOTAL KNEE Right 2018   TONSILECTOMY, ADENOIDECTOMY, BILATERAL MYRINGOTOMY AND TUBES     TUBAL LIGATION      OB History   No obstetric history on file.      Home Medications    Prior to Admission medications   Medication Sig Start Date End Date Taking? Authorizing Provider  amoxicillin-clavulanate (AUGMENTIN) 875-125 MG tablet Take 1 tablet by mouth every 12 (twelve) hours for 10 days. 11/24/23 12/04/23 Yes Christan Defranco, Ali Lowe, NP  montelukast (SINGULAIR) 10 MG  tablet Take 10 mg by mouth at bedtime.   Yes [provider]  albuterol (VENTOLIN HFA) 108 (90 Base) MCG/ACT inhaler Inhale 1 puff into the lungs every 6 (six) hours as needed for wheezing or shortness of breath. 12/13/22   Merrilee Jansky, MD  atorvastatin (LIPITOR) 10 MG tablet Take 10 mg by mouth daily.    [provider]  cetirizine (ZYRTEC) 10 MG tablet Take 10 mg by mouth daily.    [provider]  mupirocin cream (BACTROBAN) 2 % Apply 1 Application topically 2 (two) times daily.    [provider]    Family History Family History  Problem Relation Age of Onset   Esophageal cancer Father    Colon cancer Neg Hx    Rectal cancer Neg Hx    Stomach cancer Neg Hx     Social History Social History   Tobacco Use   Smoking status: Never   Smokeless tobacco: Never  Vaping Use   Vaping status: Never Used  Substance Use Topics   Alcohol use: Never   Drug use: Never     Allergies   Fluconazole and Nifedipine   Review of Systems Review of Systems  Constitutional:  Positive for activity change, appetite change and fatigue. Negative for chills, diaphoresis and fever.  HENT:  Positive for congestion, postnasal drip, rhinorrhea, sinus pressure and sinus pain. Negative for ear discharge, ear pain, mouth sores, sore throat and trouble swallowing.   Eyes:  Negative for pain and itching (but eye irritation).  Respiratory:  Negative for cough, chest tightness, shortness of breath and wheezing.   Gastrointestinal:  Negative for diarrhea and vomiting.  Musculoskeletal:  Negative for arthralgias, myalgias, neck pain and neck stiffness.  Skin:  Negative for color change and rash.  Allergic/Immunologic: Positive for environmental allergies. Negative for food allergies and immunocompromised state.  Neurological:  Positive for headaches. Negative for dizziness, tremors, seizures, syncope, speech difficulty and numbness.  Hematological:  Negative for  adenopathy. Does not bruise/bleed easily.     Physical Exam Triage Vital Signs ED Triage Vitals  Encounter Vitals Group     BP 11/24/23 1828 127/86     Systolic BP Percentile --      Diastolic BP Percentile --      Pulse Rate 11/24/23 1828 86     Resp 11/24/23 1828 18     Temp 11/24/23 1828 (!) 97.5 F (36.4 C)     Temp Source 11/24/23 1828 Oral     SpO2 11/24/23 1828 96 %     Weight --      Height --      Head Circumference --      Peak Flow --      Pain Score 11/24/23 1826 0     Pain Loc --      Pain Education --      Exclude from Growth Chart --    No data found.  Updated Vital Signs BP 127/86 (BP Location: Right Arm)   Pulse 86   Temp (!) 97.5 F (36.4 C) (Oral)   Resp 18   SpO2 96%   Visual Acuity Right Eye Distance:   Left Eye Distance:   Bilateral Distance:    Right Eye Near:   Left Eye Near:    Bilateral Near:     Physical Exam Vitals and nursing note reviewed.  Constitutional:      General: She is awake. She is not in acute distress.    Appearance: She is well-developed. She is ill-appearing.     Comments: She is sitting in the exam chair in no acute distress but appears tired and ill.   HENT:     Head: Normocephalic and atraumatic.     Right Ear: Hearing, ear canal and external ear normal. No drainage. Tympanic membrane is bulging. Tympanic membrane is not injected, erythematous or retracted.     Left Ear: Hearing, ear canal and external ear normal. No drainage. Tympanic membrane is bulging. Tympanic membrane is not injected, erythematous or retracted.     Nose: Congestion present.     Right Sinus: Maxillary sinus tenderness present. No frontal sinus tenderness.     Left Sinus: Maxillary sinus tenderness present. No frontal sinus tenderness.     Mouth/Throat:     Lips: Pink.     Mouth: Mucous membranes are moist.     Pharynx: Uvula midline. Posterior oropharyngeal erythema and postnasal drip present. No pharyngeal swelling, oropharyngeal exudate  or uvula swelling.      Comments: Slight swelling around the lower ethmoid sinuses.  Eyes:     Extraocular Movements: Extraocular movements intact.     Conjunctiva/sclera:     Right eye: Right conjunctiva is injected.     Left eye: Left conjunctiva is injected.     Comments: Clear drainage from  both eyes  Cardiovascular:     Rate and Rhythm: Normal rate and regular rhythm.     Heart sounds: Normal heart sounds. No murmur heard. Pulmonary:     Effort: Pulmonary effort is normal. No tachypnea or respiratory distress.     Breath sounds: Normal breath sounds and air entry. No decreased air movement. No decreased breath sounds, wheezing, rhonchi or rales.  Musculoskeletal:     Cervical back: Normal range of motion and neck supple.  Lymphadenopathy:     Cervical: No cervical adenopathy.  Skin:    General: Skin is warm and dry.     Capillary Refill: Capillary refill takes less than 2 seconds.     Findings: No erythema or rash.  Neurological:     General: No focal deficit present.     Mental Status: She is alert and oriented to person, place, and time.  Psychiatric:        Mood and Affect: Mood normal.        Behavior: Behavior normal. Behavior is cooperative.        Thought Content: Thought content normal.        Judgment: Judgment normal.      UC Treatments / Results  Labs (all labs ordered are listed, but only abnormal results are displayed) Labs Reviewed - No data to display  EKG   Radiology No results found.  Procedures Procedures (including critical care time)  Medications Ordered in UC Medications - No data to display  Initial Impression / Assessment and Plan / UC Course  I have reviewed the triage vital signs and the nursing notes.  Pertinent labs & imaging results that were available during my care of the patient were reviewed by me and considered in my medical decision making (see chart for details).     Reviewed with patient that she appears to have a  sinus infection with underlying allergic rhinitis. Due to worsening of symptoms with persistence of symptoms over 2 months, will treat for probable bacterial etiology for extended time (10 days). Will start Augmentin 875mg  twice a day- take with food. Continue to push fluids to help loosen up mucus in sinuses. Due to excessive rhinorrhea that may be caused by higher doses of Guaifenesin, recommend stop Mucinex for now. Continue the Zyrtec and Singulair daily. Patient does not have a local PCP. Discussed that she may need to see an ENT if symptoms do not improve. Follow-up here in 5 to 7 days if not improving.   Final Clinical Impressions(s) / UC Diagnoses   Final diagnoses:  Acute non-recurrent maxillary sinusitis  Sinus pressure  Acute ethmoidal sinusitis, recurrence not specified     Discharge Instructions      Recommend start Augmentin 875mg  twice a day for 10 days- take with food. Continue to push fluids to help loosen up mucus in sinuses. Stop Mucinex for now. Continue Zyrtec and Singulair daily. Follow-up here in 5 to 7 days if not improving.     ED Prescriptions     Medication Sig Dispense Auth. Provider   amoxicillin-clavulanate (AUGMENTIN) 875-125 MG tablet Take 1 tablet by mouth every 12 (twelve) hours for 10 days. 20 tablet Izaah Westman, Ali Lowe, NP      PDMP not reviewed this encounter.   Sudie Grumbling, NP 11/25/23 1308

## 2023-11-24 NOTE — ED Triage Notes (Signed)
 No fever, I can't get that off. I had sinus congestion and I get it for 5 or 6 days and it goes away only to come back a few days later this has been going on for2 months. - Entered by patient  Pt states she uses Mucinex ES Sinus and Cold when the sinus infection comes but after taking Mucinex  for 5 days the symptoms will improve for 2 to 3 days but then worsen again. Pt also states her nose "nose runs like a faucet" and gets ice cold at night time.

## 2023-11-26 ENCOUNTER — Other Ambulatory Visit

## 2023-12-14 ENCOUNTER — Ambulatory Visit (HOSPITAL_COMMUNITY)
Admission: RE | Admit: 2023-12-14 | Discharge: 2023-12-14 | Disposition: A | Discharge status: Home / Self Care | Source: Ambulatory Visit | Attending: General Surgery | Admitting: General Surgery

## 2023-12-14 ENCOUNTER — Encounter: Payer: Self-pay | Admitting: General Surgery

## 2023-12-14 DIAGNOSIS — Z9884 Bariatric surgery status: Secondary | ICD-10-CM | POA: Insufficient documentation

## 2023-12-27 ENCOUNTER — Other Ambulatory Visit

## 2024-01-23 ENCOUNTER — Ambulatory Visit
Admission: RE | Admit: 2024-01-23 | Discharge: 2024-01-23 | Disposition: A | Discharge status: Home / Self Care | Source: Ambulatory Visit | Attending: Family Medicine | Admitting: Family Medicine

## 2024-01-23 VITALS — BP 123/84 | HR 70 | Temp 98.1°F | Resp 17

## 2024-01-23 DIAGNOSIS — N3000 Acute cystitis without hematuria: Secondary | ICD-10-CM | POA: Insufficient documentation

## 2024-01-23 LAB — POCT URINALYSIS DIP (MANUAL ENTRY)
Bilirubin, UA: NEGATIVE
Glucose, UA: NEGATIVE mg/dL
Ketones, POC UA: NEGATIVE mg/dL
Nitrite, UA: NEGATIVE
Protein Ur, POC: >=300 mg/dL — AB
Spec Grav, UA: >=1.03 — AB (ref 1.010–1.025)
Urobilinogen, UA: 0.2 U/dL
pH, UA: 6 (ref 5.0–8.0)

## 2024-01-23 MED ORDER — CEPHALEXIN 500 MG PO CAPS: 500.0000 mg | ORAL_CAPSULE | Freq: Two times a day (BID) | ORAL | 0 refills | Status: AC

## 2024-01-23 NOTE — ED Triage Notes (Signed)
 Pt c/o urinary frequency and increased urination for 2 days. She also had pressure at end of urination.

## 2024-01-23 NOTE — ED Provider Notes (Signed)
 [facility]    CSN: [id number] Arrival date & time: 01/23/24  1255      History   Chief Complaint Chief Complaint  Patient presents with   Urinary Frequency    Possible UTI - Entered by patient    HPI Kaitlyn Campos is a 63 y.o. female.   Kaitlyn Campos is a 63 y.o. female presenting for chief complaint of dysuria, urinary frequency, urinary urgency, and low back pain that started 2 to 3 days ago.  She additionally reports generalized head pain associated with onset of symptoms.  States she gets urinary tract infections 2-3 times per year and this feels similar.  Denies gross hematuria, nausea, vomiting, diarrhea, abdominal pain, dizziness, viral URI symptoms, fever, chills, and frequent intake of urinary irritants.  She is not a diabetic and does not take an SGLT2 inhibitor.  Denies recent antibiotic or steroid use in the last 90 days.  Drinking cranberry juice with some relief.   Urinary Frequency    Past Medical History:  Diagnosis Date   Allergy    Cancer [facility])    breast cancer  right    Hyperlipidemia    Seasonal allergies     There are no active problems to display for this patient.   Past Surgical History:  Procedure Laterality Date   APPENDECTOMY     BREAST LUMPECTOMY Right    LAPAROSCOPIC REPAIR AND REMOVAL OF GASTRIC BAND  2010   PLACEMENT OF BREAST IMPLANTS Bilateral 2001   REPLACEMENT TOTAL KNEE Right 2018   TONSILECTOMY, ADENOIDECTOMY, BILATERAL MYRINGOTOMY AND TUBES     TUBAL LIGATION      OB History   No obstetric history on file.      Home Medications    Prior to Admission medications   Medication Sig Start Date End Date Taking? Authorizing Provider  cephALEXin  (KEFLEX ) 500 MG capsule Take 1 capsule (500 mg total) by mouth 2 (two) times daily for 7 days. 01/23/24 01/30/24 Yes StanhopeDanny Dye, FNP  albuterol  (VENTOLIN  HFA) 108 (90 Base) MCG/ACT inhaler Inhale 1 puff into the lungs every 6 (six) hours as needed for wheezing or  shortness of breath. 12/13/22   Corine Dice, MD  atorvastatin (LIPITOR) 10 MG tablet Take 10 mg by mouth daily.    [provider]  cetirizine (ZYRTEC) 10 MG tablet Take 10 mg by mouth daily.    [provider]  montelukast (SINGULAIR) 10 MG tablet Take 10 mg by mouth at bedtime.    [provider]  mupirocin  cream (BACTROBAN ) 2 % Apply 1 Application topically 2 (two) times daily.    [provider]    Family History Family History  Problem Relation Age of Onset   Esophageal cancer Father    Colon cancer Neg Hx    Rectal cancer Neg Hx    Stomach cancer Neg Hx     Social History Social History   Tobacco Use   Smoking status: Never   Smokeless tobacco: Never  Vaping Use   Vaping status: Never Used  Substance Use Topics   Alcohol use: Never   Drug use: Never     Allergies   Fluconazole and Nifedipine   Review of Systems Review of Systems  Genitourinary:  Positive for frequency.  Per HPI   Physical Exam Triage Vital Signs ED Triage Vitals  Encounter Vitals Group     BP 01/23/24 1336 123/84     Systolic BP Percentile --  Diastolic BP Percentile --      Pulse Rate 01/23/24 1336 70     Resp 01/23/24 1336 17     Temp 01/23/24 1336 98.1 F (36.7 C)     Temp Source 01/23/24 1336 Oral     SpO2 01/23/24 1336 94 %     Weight --      Height --      Head Circumference --      Peak Flow --      Pain Score 01/23/24 1339 0     Pain Loc --      Pain Education --      Exclude from Growth Chart --    No data found.  Updated Vital Signs BP 123/84 (BP Location: Right Arm)   Pulse 70   Temp 98.1 F (36.7 C) (Oral)   Resp 17   SpO2 94%   Visual Acuity Right Eye Distance:   Left Eye Distance:   Bilateral Distance:    Right Eye Near:   Left Eye Near:    Bilateral Near:     Physical Exam Vitals and nursing note reviewed.  Constitutional:      Appearance: She is not ill-appearing or toxic-appearing.  HENT:     Head:  Normocephalic and atraumatic.     Right Ear: Hearing and external ear normal.     Left Ear: Hearing and external ear normal.     Nose: Nose normal.     Mouth/Throat:     Lips: Pink.  Eyes:     General: Lids are normal. Vision grossly intact. Gaze aligned appropriately.     Extraocular Movements: Extraocular movements intact.     Conjunctiva/sclera: Conjunctivae normal.  Pulmonary:     Effort: Pulmonary effort is normal.  Abdominal:     General: Bowel sounds are normal.     Palpations: Abdomen is soft.     Tenderness: There is no abdominal tenderness. There is no right CVA tenderness, left CVA tenderness or guarding.  Musculoskeletal:     Cervical back: Neck supple.  Skin:    General: Skin is warm and dry.     Capillary Refill: Capillary refill takes less than 2 seconds.     Findings: No rash.  Neurological:     General: No focal deficit present.     Mental Status: She is alert and oriented to person, place, and time. Mental status is at baseline.     Cranial Nerves: No dysarthria or facial asymmetry.  Psychiatric:        Mood and Affect: Mood normal.        Speech: Speech normal.        Behavior: Behavior normal.        Thought Content: Thought content normal.        Judgment: Judgment normal.      UC Treatments / Results  Labs (all labs ordered are listed, but only abnormal results are displayed) Labs Reviewed  POCT URINALYSIS DIP (MANUAL ENTRY) - Abnormal; Notable for the following components:      Result Value   Clarity, UA turbid (*)    Spec Grav, UA >=1.030 (*)    Blood, UA large (*)    Protein Ur, POC >=300 (*)    Leukocytes, UA Small (1+) (*)    All other components within normal limits  URINE CULTURE    EKG   Radiology No results found.  Procedures Procedures (including critical care time)  Medications Ordered in UC Medications - No  data to display  Initial Impression / Assessment and Plan / UC Course  I have reviewed the triage vital signs and  the nursing notes.  Pertinent labs & imaging results that were available during my care of the patient were reviewed by me and considered in my medical decision making (see chart for details).   1. Acute cystitis without hematuria Evaluation suggests acute uncomplicated cystitis.  Urine culture pending.  Low suspicion for acute pyelonephritis, kidney stone or infected stone.  Keflex  antibiotic ordered. Appears well hydrated, therefore will defer labs/imaging.  Patient to push fluids to stay well hydrated and reduce intake of known urinary irritants.  Discussed methods of preventing future UTI.   Counseled patient on potential for adverse effects with medications prescribed/recommended today, strict ER and return-to-clinic precautions discussed, patient verbalized understanding.    Final Clinical Impressions(s) / UC Diagnoses   Final diagnoses:  Acute cystitis without hematuria     Discharge Instructions      Your urine shows you likely have a urinary tract infection.  I have sent your urine for culture to confirm this.  We will call you if we need to change your antibiotic when we find out the type of bacteria growing in your bladder.  Take antibiotic as directed with a snack/food to avoid stomach upset. To avoid GI upset please take this medication with food.   Avoid drinking beverages that irritate the urinary tract like sodas, tea, coffee, or juice. Drink plenty of water to stay well hydrated and prevent severe infection.  If you develop any new or worsening symptoms or if your symptoms do not start to improve, pleases return here or follow-up with your primary care provider. If your symptoms are severe, please go to the emergency room.   ED Prescriptions     Medication Sig Dispense Auth. Provider   cephALEXin  (KEFLEX ) 500 MG capsule Take 1 capsule (500 mg total) by mouth 2 (two) times daily for 7 days. 14 capsule Starlene Eaton, FNP      PDMP not reviewed this  encounter.   Starlene Eaton, [place] 01/23/24 1418

## 2024-01-23 NOTE — Discharge Instructions (Signed)

## 2024-01-25 ENCOUNTER — Telehealth: Payer: Self-pay

## 2024-01-25 LAB — URINE CULTURE: Culture: 30000 — AB

## 2024-01-25 NOTE — Telephone Encounter (Signed)
 This RN returned patient's phone call at this time. Name and DOB verified. Pt was seen here on 4/27. Pt called with questions regarding her urine culture results. Rebecca Rising PA reviewed results, states the antibiotics prescribed is appropriate for the bacteria found on culture. Pt verbalized understanding.   Pt also voiced concerns of diabetes and wanting blood work completed. This RN explained to patient that she would need to return here for a visit. Offered to set up a primary care appointment as well, pt declined. Given directions on how to set up a primary care appointment. Verbalized understanding. No further questions/concerns at this time.

## 2024-03-27 ENCOUNTER — Ambulatory Visit: Admitting: Family Medicine

## 2024-04-10 ENCOUNTER — Ambulatory Visit
Admission: RE | Admit: 2024-04-10 | Discharge: 2024-04-10 | Disposition: A | Discharge status: Home / Self Care | Source: Ambulatory Visit

## 2024-04-10 VITALS — BP 120/78 | HR 79 | Temp 97.6°F | Resp 18 | Ht 66.0 in | Wt 164.9 lb

## 2024-04-10 DIAGNOSIS — R1900 Intra-abdominal and pelvic swelling, mass and lump, unspecified site: Secondary | ICD-10-CM | POA: Insufficient documentation

## 2024-04-10 DIAGNOSIS — A630 Anogenital (venereal) warts: Secondary | ICD-10-CM | POA: Insufficient documentation

## 2024-04-10 DIAGNOSIS — Z889 Allergy status to unspecified drugs, medicaments and biological substances status: Secondary | ICD-10-CM | POA: Insufficient documentation

## 2024-04-10 DIAGNOSIS — C50919 Malignant neoplasm of unspecified site of unspecified female breast: Secondary | ICD-10-CM | POA: Insufficient documentation

## 2024-04-10 DIAGNOSIS — K529 Noninfective gastroenteritis and colitis, unspecified: Secondary | ICD-10-CM | POA: Insufficient documentation

## 2024-04-10 DIAGNOSIS — L819 Disorder of pigmentation, unspecified: Secondary | ICD-10-CM | POA: Diagnosis not present

## 2024-04-10 DIAGNOSIS — N393 Stress incontinence (female) (male): Secondary | ICD-10-CM | POA: Insufficient documentation

## 2024-04-10 DIAGNOSIS — Z9889 Other specified postprocedural states: Secondary | ICD-10-CM | POA: Insufficient documentation

## 2024-04-10 DIAGNOSIS — Z029 Encounter for administrative examinations, unspecified: Secondary | ICD-10-CM | POA: Insufficient documentation

## 2024-04-10 DIAGNOSIS — Z736 Limitation of activities due to disability: Secondary | ICD-10-CM | POA: Insufficient documentation

## 2024-04-10 DIAGNOSIS — F5102 Adjustment insomnia: Secondary | ICD-10-CM | POA: Insufficient documentation

## 2024-04-10 DIAGNOSIS — Z9884 Bariatric surgery status: Secondary | ICD-10-CM | POA: Insufficient documentation

## 2024-04-10 DIAGNOSIS — I7300 Raynaud's syndrome without gangrene: Secondary | ICD-10-CM

## 2024-04-10 DIAGNOSIS — Z01818 Encounter for other preprocedural examination: Secondary | ICD-10-CM | POA: Insufficient documentation

## 2024-04-10 DIAGNOSIS — Z6835 Body mass index (BMI) 35.0-35.9, adult: Secondary | ICD-10-CM | POA: Insufficient documentation

## 2024-04-10 DIAGNOSIS — M779 Enthesopathy, unspecified: Secondary | ICD-10-CM | POA: Insufficient documentation

## 2024-04-10 DIAGNOSIS — J310 Chronic rhinitis: Secondary | ICD-10-CM | POA: Insufficient documentation

## 2024-04-10 DIAGNOSIS — R1013 Epigastric pain: Secondary | ICD-10-CM | POA: Insufficient documentation

## 2024-04-10 DIAGNOSIS — D0590 Unspecified type of carcinoma in situ of unspecified breast: Secondary | ICD-10-CM | POA: Insufficient documentation

## 2024-04-10 DIAGNOSIS — R1310 Dysphagia, unspecified: Secondary | ICD-10-CM | POA: Insufficient documentation

## 2024-04-10 DIAGNOSIS — Z136 Encounter for screening for cardiovascular disorders: Secondary | ICD-10-CM | POA: Insufficient documentation

## 2024-04-10 DIAGNOSIS — Z7189 Other specified counseling: Secondary | ICD-10-CM | POA: Insufficient documentation

## 2024-04-10 DIAGNOSIS — Z713 Dietary counseling and surveillance: Secondary | ICD-10-CM | POA: Insufficient documentation

## 2024-04-10 DIAGNOSIS — J309 Allergic rhinitis, unspecified: Secondary | ICD-10-CM | POA: Insufficient documentation

## 2024-04-10 DIAGNOSIS — F54 Psychological and behavioral factors associated with disorders or diseases classified elsewhere: Secondary | ICD-10-CM | POA: Insufficient documentation

## 2024-04-10 DIAGNOSIS — F411 Generalized anxiety disorder: Secondary | ICD-10-CM | POA: Insufficient documentation

## 2024-04-10 DIAGNOSIS — Z4651 Encounter for fitting and adjustment of gastric lap band: Secondary | ICD-10-CM | POA: Insufficient documentation

## 2024-04-10 DIAGNOSIS — K121 Other forms of stomatitis: Secondary | ICD-10-CM | POA: Insufficient documentation

## 2024-04-10 DIAGNOSIS — L905 Scar conditions and fibrosis of skin: Secondary | ICD-10-CM | POA: Insufficient documentation

## 2024-04-10 DIAGNOSIS — K219 Gastro-esophageal reflux disease without esophagitis: Secondary | ICD-10-CM | POA: Insufficient documentation

## 2024-04-10 DIAGNOSIS — N915 Oligomenorrhea, unspecified: Secondary | ICD-10-CM | POA: Insufficient documentation

## 2024-04-10 DIAGNOSIS — R0689 Other abnormalities of breathing: Secondary | ICD-10-CM | POA: Insufficient documentation

## 2024-04-10 DIAGNOSIS — E663 Overweight: Secondary | ICD-10-CM | POA: Insufficient documentation

## 2024-04-10 DIAGNOSIS — Z0189 Encounter for other specified special examinations: Secondary | ICD-10-CM | POA: Insufficient documentation

## 2024-04-10 NOTE — ED Triage Notes (Signed)
 I have raynauds disease, I noticed yesterday my right middle finger was almost black, then blue today now red/black again with some swelling. This happens 1-2x every 2-3 wks.

## 2024-04-10 NOTE — ED Provider Notes (Signed)
 [facility]    CSN: [id number] Arrival date & time: 04/10/24  1829      History   Chief Complaint Chief Complaint  Patient presents with   Finger Injury    Entered by patient    HPI ACASIA SKILTON is a 63 y.o. female.   Patient presents today for evaluation of discoloration to her right middle finger.  She reports that symptoms started yesterday after she had been riding the tractor and doing some yard work.  She states that she did not have injury or pain.  She notes initially it looked as if she had a bruise to the middle of her finger and then eventually finger became significantly darker to almost black yesterday.  She notes today was more blue.  She has had some associated swelling but this is improved.  She denies any pain still.  She does not report any numbness or tingling.  She has had a diagnosis of Raynaud's phenomenon but typically discoloration resolves within a day and this has lasted longer.  The history is provided by the patient.    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   Status post bariatric surgery 04/10/2024   Bariatric surgery status 04/10/2024   Encounter for special screening examination for cardiovascular disorder 04/10/2024   Mass of abdomen 04/10/2024   Body mass index 35.0-35.9, adult 04/10/2024   Breast cancer (HCC) 04/10/2024   Carcinoma in situ of breast 04/10/2024   Allergic rhinitis 04/10/2024   Chronic rhinitis 04/10/2024   Difficulty breathing 04/10/2024   Dyspepsia 04/10/2024   Encounter for laboratory test 04/10/2024   Dietary counseling and surveillance 04/10/2024   Dysphagia 04/10/2024   Fitting and adjustment of gastric lap band 04/10/2024   Encounters for administrative purposes 04/10/2024   Other specified counseling 04/10/2024    Enthesitis 04/10/2024   Gastroenteritis 04/10/2024   Female stress incontinence 04/10/2024   Esophageal reflux 04/10/2024   Generalized anxiety disorder 04/10/2024   Limitation of activities due to disability 04/10/2024   Maladaptive health behaviors affecting medical condition 04/10/2024   Morbid obesity (HCC) 04/10/2024   Oligomenorrhea 04/10/2024   Overweight 04/10/2024   Postprocedural state 04/10/2024   Pre-operative examination 04/10/2024   Scar 04/10/2024   Drug allergy 04/10/2024   Stomatitis 04/10/2024   Venereal wart 04/10/2024   Hyperlipidemia 07/20/2023   Encounter for general adult medical examination without abnormal findings 07/15/2023   Herpes simplex 07/15/2023   Pain 07/15/2023   Benign paroxysmal vertigo, bilateral 03/02/2017   History of breast cancer 01/05/2017   Raynaud's phenomenon without gangrene 01/05/2017   Seasonal allergies 01/05/2017   Syncope 01/05/2017   Pain in left hip 04/05/2015    Past Surgical History:  Procedure Laterality Date   APPENDECTOMY     BREAST LUMPECTOMY Right    LAPAROSCOPIC REPAIR AND REMOVAL OF GASTRIC BAND  2010   PLACEMENT OF BREAST IMPLANTS Bilateral 2001   REPLACEMENT TOTAL KNEE Right 2018   TONSILECTOMY, ADENOIDECTOMY, BILATERAL MYRINGOTOMY AND TUBES     TUBAL LIGATION      OB History   No obstetric history on file.      Home Medications    Prior to Admission medications   Medication Sig Start Date End Date Taking? Authorizing Provider  Cephalexin  500 MG tablet Take 500 mg by mouth 3 (three) times daily. 11/01/19  Yes [provider]  ibuprofen (ADVIL) 600 MG tablet Take 600 mg by mouth every 6 (six) hours as needed. 01/27/24  Yes [provider]  ibuprofen (ADVIL) 800 MG tablet Take 800 mg by mouth every 8 (eight) hours as needed. 03/13/24  Yes [provider]  lidocaine (LIDODERM) 5 % Apply 1 patch topically daily. 07/20/23  Yes [provider]  phentermine (ADIPEX-P) 37.5 MG  tablet Take 37.5 mg by mouth daily. 10/15/23  Yes [provider]  phentermine 15 MG capsule Take 15 mg by mouth every morning. 03/23/24 05/22/24 Yes [provider]  topiramate (TOPAMAX) 25 MG tablet Take 25 mg by mouth daily. 11/19/23  Yes [provider]  valACYclovir (VALTREX) 1000 MG tablet Take 1 g by mouth as directed. 07/20/23 07/19/25 Yes [provider]  albuterol  (VENTOLIN  HFA) 108 (90 Base) MCG/ACT inhaler Inhale 1 puff into the lungs every 6 (six) hours as needed for wheezing or shortness of breath. 12/13/22   Blaise Aleene KIDD, MD  atorvastatin (LIPITOR) 10 MG tablet Take 10 mg by mouth daily.    [provider]  calcium  carbonate (TUMS EX) 750 MG chewable tablet Chew 1 tablet by mouth 2 (two) times daily.    [provider]  cephALEXin  (KEFLEX ) 500 MG capsule Take 500 mg by mouth 3 (three) times daily.    [provider]  cetirizine (ZYRTEC) 10 MG tablet Take 10 mg by mouth daily.    [provider]  hydrocortisone cream 1 % Apply 1 Application topically 2 (two) times daily.    [provider]  montelukast (SINGULAIR) 10 MG tablet Take 10 mg by mouth at bedtime.    [provider]  mupirocin  cream (BACTROBAN ) 2 % Apply 1 Application topically 2 (two) times daily.    [provider]  predniSONE  (DELTASONE ) 10 MG tablet Take 1 tablet by mouth daily.    [provider]  topiramate (TOPAMAX) 50 MG tablet Take 50 mg by mouth daily.    [provider]    Family History Family History  Problem Relation Age of Onset   Esophageal cancer Father    Colon cancer Neg Hx    Rectal cancer Neg Hx    Stomach cancer Neg Hx     Social History Social History   Tobacco Use   Smoking status: Never   Smokeless tobacco: Never  Vaping Use   Vaping status: Never Used  Substance Use Topics   Alcohol use: Never   Drug use: Never     Allergies   Fluconazole and  Nifedipine   Review of Systems Review of Systems  Constitutional:  Negative for chills and fever.  Eyes:  Negative for discharge and redness.  Respiratory:  Negative for shortness of breath.   Musculoskeletal:  Negative for arthralgias and myalgias.  Skin:  Positive for color change. Negative for wound.  Neurological:  Negative for numbness.     Physical Exam Triage Vital Signs ED Triage Vitals [04/10/24 1839]  Encounter Vitals Group     BP      Girls Systolic BP Percentile      Girls Diastolic BP Percentile      Boys Systolic BP Percentile      Boys Diastolic BP Percentile      Pulse      Resp      Temp      Temp src  SpO2      Weight      Height      Head Circumference      Peak Flow      Pain Score 0     Pain Loc      Pain Education      Exclude from Growth Chart    No data found.  Updated Vital Signs BP 120/78 (BP Location: Left Arm)   Pulse 79   Temp 97.6 F (36.4 C) (Oral)   Resp 18   Ht 5' 6 (1.676 m)   Wt 164 lb 14.5 oz (74.8 kg)   SpO2 97%   BMI 26.62 kg/m   Visual Acuity Right Eye Distance:   Left Eye Distance:   Bilateral Distance:    Right Eye Near:   Left Eye Near:    Bilateral Near:     Physical Exam Vitals and nursing note reviewed.  Constitutional:      General: She is not in acute distress.    Appearance: Normal appearance. She is not ill-appearing.  HENT:     Head: Normocephalic and atraumatic.  Eyes:     Conjunctiva/sclera: Conjunctivae normal.  Cardiovascular:     Rate and Rhythm: Normal rate.  Pulmonary:     Effort: Pulmonary effort is normal. No respiratory distress.  Skin:    Capillary Refill: Normal cap refill to right middle finger    Comments: See photo  Neurological:     Mental Status: She is alert.     Comments: Gross sensation intact to distal right middle finger  Psychiatric:        Mood and Affect: Mood normal.        Behavior: Behavior normal.        Thought Content: Thought content normal.          UC Treatments / Results  Labs (all labs ordered are listed, but only abnormal results are displayed) Labs Reviewed - No data to display  EKG   Radiology No results found.  Procedures Procedures (including critical care time)  Medications Ordered in UC Medications - No data to display  Initial Impression / Assessment and Plan / UC Course  I have reviewed the triage vital signs and the nursing notes.  Pertinent labs & imaging results that were available during my care of the patient were reviewed by me and considered in my medical decision making (see chart for details).    Patient with painless discoloration- recommended further evaluation in the ED should she develop pain in the finger or if discoloration does not improve by morning. Currently digit appears neurovascularly intact. Patient expresses understanding of plan.   Final Clinical Impressions(s) / UC Diagnoses   Final diagnoses:  Discoloration of skin of finger  Raynaud's phenomenon without gangrene   Discharge Instructions   None    ED Prescriptions   None    PDMP not reviewed this encounter.   Billy Asberry FALCON, PA-C 04/10/24 1921

## 2024-04-25 ENCOUNTER — Encounter: Payer: Self-pay | Admitting: General Practice

## 2024-05-02 ENCOUNTER — Ambulatory Visit (INDEPENDENT_AMBULATORY_CARE_PROVIDER_SITE_OTHER): Admitting: Family Medicine

## 2024-05-02 ENCOUNTER — Encounter: Payer: Self-pay | Admitting: Family Medicine

## 2024-05-02 VITALS — BP 132/88 | HR 63 | Ht 66.0 in | Wt 134.2 lb

## 2024-05-02 DIAGNOSIS — Z9884 Bariatric surgery status: Secondary | ICD-10-CM | POA: Diagnosis not present

## 2024-05-02 DIAGNOSIS — Z7689 Persons encountering health services in other specified circumstances: Secondary | ICD-10-CM

## 2024-05-02 DIAGNOSIS — Z8619 Personal history of other infectious and parasitic diseases: Secondary | ICD-10-CM | POA: Diagnosis not present

## 2024-05-02 NOTE — Progress Notes (Unsigned)
 New Patient Office Visit  Subjective    Patient ID: Kaitlyn Campos, female    DOB: 09-Jun-1961  Age: 63 y.o. MRN: [id number]  CC:  Chief Complaint  Patient presents with   Establish Care    No concerns     HPI Kaitlyn Campos presents to establish care ***  Outpatient Encounter Medications as of 05/02/2024  Medication Sig   atorvastatin (LIPITOR) 10 MG tablet Take 10 mg by mouth daily.   cetirizine (ZYRTEC) 10 MG tablet Take 10 mg by mouth daily.   ibuprofen (ADVIL) 600 MG tablet Take 600 mg by mouth every 6 (six) hours as needed.   ibuprofen (ADVIL) 800 MG tablet Take 800 mg by mouth every 8 (eight) hours as needed.   montelukast (SINGULAIR) 10 MG tablet Take 10 mg by mouth at bedtime.   phentermine (ADIPEX-P) 37.5 MG tablet Take 37.5 mg by mouth daily.   phentermine 15 MG capsule Take 15 mg by mouth every morning.   topiramate (TOPAMAX) 50 MG tablet Take 50 mg by mouth daily.   valACYclovir (VALTREX) 1000 MG tablet Take 1 g by mouth as directed.   albuterol  (VENTOLIN  HFA) 108 (90 Base) MCG/ACT inhaler Inhale 1 puff into the lungs every 6 (six) hours as needed for wheezing or shortness of breath. (Patient not taking: Reported on 05/02/2024)   calcium  carbonate (TUMS EX) 750 MG chewable tablet Chew 1 tablet by mouth 2 (two) times daily. (Patient not taking: Reported on 05/02/2024)   cephALEXin  (KEFLEX ) 500 MG capsule Take 500 mg by mouth 3 (three) times daily. (Patient not taking: Reported on 05/02/2024)   Cephalexin  500 MG tablet Take 500 mg by mouth 3 (three) times daily. (Patient not taking: Reported on 05/02/2024)   hydrocortisone cream 1 % Apply 1 Application topically 2 (two) times daily.   lidocaine (LIDODERM) 5 % Apply 1 patch topically daily.   mupirocin  cream (BACTROBAN ) 2 % Apply 1 Application topically 2 (two) times daily.   predniSONE  (DELTASONE ) 10 MG tablet Take 1 tablet by mouth daily.   topiramate (TOPAMAX) 25 MG tablet Take 25 mg by mouth daily. (Patient not taking: Reported  on 05/02/2024)   No facility-administered encounter medications on file as of 05/02/2024.    Past Medical History:  Diagnosis Date   Allergy    Cancer [facility])    breast cancer  right    Hyperlipidemia    Seasonal allergies     Past Surgical History:  Procedure Laterality Date   APPENDECTOMY     BREAST LUMPECTOMY Right    LAPAROSCOPIC REPAIR AND REMOVAL OF GASTRIC BAND  2010   PLACEMENT OF BREAST IMPLANTS Bilateral 2001   REPLACEMENT TOTAL KNEE Right 2018   TONSILECTOMY, ADENOIDECTOMY, BILATERAL MYRINGOTOMY AND TUBES     TUBAL LIGATION      Family History  Problem Relation Age of Onset   Esophageal cancer Father    Colon cancer Neg Hx    Rectal cancer Neg Hx    Stomach cancer Neg Hx     Social History   Socioeconomic History   Marital status: Married    Spouse name: Not on file   Number of children: Not on file   Years of education: Not on file   Highest education level: Not on file  Occupational History   Not on file  Tobacco Use   Smoking status: Never   Smokeless tobacco: Never  Vaping Use   Vaping status: Never Used  Substance and Sexual Activity  Alcohol use: Never   Drug use: Never   Sexual activity: Not Currently  Other Topics Concern   Not on file  Social History Narrative   Not on file   Social Drivers of Health   Financial Resource Strain: Not on file  Food Insecurity: Not on file  Transportation Needs: Not on file  Physical Activity: Not on file  Stress: Not on file  Social Connections: Not on file  Intimate Partner Violence: Not on file    ROS      Objective   BP 132/88   Pulse 63   Ht 5' 6 (1.676 m)   Wt 134 lb 3.2 oz (60.9 kg)   SpO2 90%   BMI 21.66 kg/m   Physical Exam  {Labs (Optional):23779}    Assessment & Plan:   There are no diagnoses linked to this encounter.   No follow-ups on file.   Tanda Raguel SQUIBB, MD

## 2024-05-04 ENCOUNTER — Encounter: Payer: Self-pay | Admitting: Family Medicine

## 2024-05-18 ENCOUNTER — Ambulatory Visit: Admitting: Family Medicine

## 2024-05-22 ENCOUNTER — Ambulatory Visit
Admission: RE | Admit: 2024-05-22 | Discharge: 2024-05-22 | Disposition: A | Discharge status: Home / Self Care | Source: Ambulatory Visit

## 2024-05-22 VITALS — BP 110/77 | HR 67 | Temp 97.7°F | Resp 14

## 2024-05-22 DIAGNOSIS — L249 Irritant contact dermatitis, unspecified cause: Secondary | ICD-10-CM | POA: Diagnosis not present

## 2024-05-22 MED ORDER — DESONIDE 0.05 % EX CREA: TOPICAL_CREAM | Freq: Two times a day (BID) | CUTANEOUS | 0 refills | Status: AC

## 2024-05-22 MED ORDER — MUPIROCIN CALCIUM 2 % EX CREA: 1.0000 | TOPICAL_CREAM | Freq: Two times a day (BID) | CUTANEOUS | 0 refills | Status: AC

## 2024-05-22 NOTE — ED Triage Notes (Signed)
 Pt reports skin concern with small red bumps on face x 5 days. She thinks it may be insect bites, but is unsure. 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 The bumps are not itchy or painful. They are tender to touch if she places a washcloth over the areas. She tried popping a few of them and noted yellow drainage. She is concerned because one is close to her R eye currently. Applied topical benadryl with no relief to skin area.

## 2024-05-22 NOTE — ED Provider Notes (Signed)
 [facility]    CSN: [id number] Arrival date & time: 05/22/24  1145      History   Chief Complaint Chief Complaint  Patient presents with   Insect Bite    On the face that nothing is helping. - Entered by patient    HPI Kaitlyn Campos is a 63 y.o. female.  Patient presents to urgent care today with concerns of an insect bite.  Reports that she has had small bumps to the right side of her face with some fluid in them that has been yellow-colored.  She is unsure if she actually has sustained an insect bite but she states that she typically notices this develop after being outside working in the yard with her chickens.  No sick contacts as far she is aware.  No fever, chills, or bodyaches.  HPI  Past Medical History:  Diagnosis Date   Allergy    Cancer [facility])    breast cancer  right    Hyperlipidemia    Seasonal allergies     Patient Active Problem List   Diagnosis Date Noted   Adjustment insomnia 04/10/2024   Status post bariatric surgery 04/10/2024   Bariatric surgery status 04/10/2024   Encounter for special screening examination for cardiovascular disorder 04/10/2024   Mass of abdomen 04/10/2024   Body mass index 35.0-35.9, adult 04/10/2024   Breast cancer (HCC) 04/10/2024   Carcinoma in situ of breast 04/10/2024   Allergic rhinitis 04/10/2024   Chronic rhinitis 04/10/2024   Difficulty breathing 04/10/2024   Dyspepsia 04/10/2024   Encounter for laboratory test 04/10/2024   Dietary counseling and surveillance 04/10/2024   Dysphagia 04/10/2024   Fitting and adjustment of gastric lap band 04/10/2024   Encounters for administrative purposes 04/10/2024   Other specified counseling 04/10/2024   Enthesitis 04/10/2024   Gastroenteritis 04/10/2024   Female stress incontinence 04/10/2024   Esophageal reflux 04/10/2024   Generalized anxiety disorder 04/10/2024   Limitation of activities due to disability 04/10/2024   Maladaptive health behaviors affecting  medical condition 04/10/2024   Morbid obesity (HCC) 04/10/2024   Oligomenorrhea 04/10/2024   Overweight 04/10/2024   Postprocedural state 04/10/2024   Pre-operative examination 04/10/2024   Scar 04/10/2024   Drug allergy 04/10/2024   Stomatitis 04/10/2024   Venereal wart 04/10/2024   Hyperlipidemia 07/20/2023   Encounter for general adult medical examination without abnormal findings 07/15/2023   Herpes simplex 07/15/2023   Pain 07/15/2023   Benign paroxysmal vertigo, bilateral 03/02/2017   History of breast cancer 01/05/2017   Raynaud's phenomenon without gangrene 01/05/2017   Seasonal allergies 01/05/2017   Syncope 01/05/2017   Pain in left hip 04/05/2015    Past Surgical History:  Procedure Laterality Date   APPENDECTOMY     BREAST LUMPECTOMY Right    LAPAROSCOPIC REPAIR AND REMOVAL OF GASTRIC BAND  2010   PLACEMENT OF BREAST IMPLANTS Bilateral 2001   REPLACEMENT TOTAL KNEE Right 2018   TONSILECTOMY, ADENOIDECTOMY, BILATERAL MYRINGOTOMY AND TUBES     TUBAL LIGATION      OB History   No obstetric history on file.      Home Medications    Prior to Admission medications   Medication Sig Start Date End Date Taking? Authorizing Provider  atorvastatin (LIPITOR) 20 MG tablet Take 20 mg by mouth. 06/06/21  Yes [provider]  cetirizine (ZYRTEC) 10 MG tablet Take 10 mg by mouth daily.   Yes [provider]  desonide  (DESOWEN ) 0.05 % cream Apply topically 2 (two)  times daily. 05/22/24  Yes Maysie Parkhill A, PA-C  montelukast (SINGULAIR) 10 MG tablet Take 10 mg by mouth at bedtime.   Yes [provider]  mupirocin  cream (BACTROBAN ) 2 % Apply 1 Application topically 2 (two) times daily. 05/22/24  Yes Henri Baumler A, PA-C  phentermine 15 MG capsule Take 15 mg by mouth every morning. 03/23/24 05/22/24 Yes [provider]  topiramate (TOPAMAX) 50 MG tablet Take 50 mg by mouth daily.   Yes [provider]  albuterol  (VENTOLIN  HFA) 108 (90  Base) MCG/ACT inhaler Inhale 1 puff into the lungs every 6 (six) hours as needed for wheezing or shortness of breath. Patient not taking: Reported on 05/02/2024 12/13/22   Blaise Aleene KIDD, MD  atorvastatin (LIPITOR) 10 MG tablet Take 10 mg by mouth daily. Patient not taking: Reported on 05/22/2024    [provider]  calcium  carbonate (TUMS EX) 750 MG chewable tablet Chew 1 tablet by mouth 2 (two) times daily. Patient not taking: Reported on 05/02/2024    [provider]  cephALEXin  (KEFLEX ) 500 MG capsule Take 500 mg by mouth 3 (three) times daily. Patient not taking: Reported on 05/02/2024    [provider]  Cephalexin  500 MG tablet Take 500 mg by mouth 3 (three) times daily. Patient not taking: Reported on 05/02/2024 11/01/19   [provider]  hydrocortisone cream 1 % Apply 1 Application topically 2 (two) times daily. Patient not taking: Reported on 05/22/2024    [provider]  ibuprofen (ADVIL) 600 MG tablet Take 600 mg by mouth every 6 (six) hours as needed. 01/27/24   [provider]  ibuprofen (ADVIL) 800 MG tablet Take 800 mg by mouth every 8 (eight) hours as needed. 03/13/24   [provider]  lidocaine (LIDODERM) 5 % Apply 1 patch topically daily. Patient not taking: Reported on 05/22/2024 07/20/23   [provider]  phentermine (ADIPEX-P) 37.5 MG tablet Take 37.5 mg by mouth daily. Patient not taking: Reported on 05/22/2024 10/15/23   [provider]  predniSONE  (DELTASONE ) 10 MG tablet Take 1 tablet by mouth daily. Patient not taking: Reported on 05/22/2024    [provider]  topiramate (TOPAMAX) 25 MG tablet Take 25 mg by mouth daily. Patient not taking: Reported on 05/02/2024 11/19/23   [provider]  valACYclovir (VALTREX) 1000 MG tablet Take 1 g by mouth as directed. Patient not taking: Reported on 05/22/2024 07/20/23 07/19/25  [provider]    Family History Family History   Problem Relation Age of Onset   Esophageal cancer Father    Colon cancer Neg Hx    Rectal cancer Neg Hx    Stomach cancer Neg Hx     Social History Social History   Tobacco Use   Smoking status: Never   Smokeless tobacco: Never  Vaping Use   Vaping status: Never Used  Substance Use Topics   Alcohol use: Never   Drug use: Never     Allergies   Fluconazole and Nifedipine   Review of Systems Review of Systems  Skin:  Positive for rash.  All other systems reviewed and are negative.    Physical Exam Triage Vital Signs ED Triage Vitals  Encounter Vitals Group     BP 05/22/24 1227 110/77     Girls Systolic BP Percentile --      Girls Diastolic BP Percentile --      Boys Systolic BP Percentile --      Boys Diastolic BP  Percentile --      Pulse Rate 05/22/24 1227 67     Resp 05/22/24 1227 14     Temp 05/22/24 1227 97.7 F (36.5 C)     Temp Source 05/22/24 1227 Oral     SpO2 05/22/24 1227 95 %     Weight --      Height --      Head Circumference --      Peak Flow --      Pain Score 05/22/24 1228 0     Pain Loc --      Pain Education --      Exclude from Growth Chart --    No data found.  Updated Vital Signs BP 110/77 (BP Location: Left Arm)   Pulse 67   Temp 97.7 F (36.5 C) (Oral)   Resp 14   SpO2 95%   Visual Acuity Right Eye Distance:   Left Eye Distance:   Bilateral Distance:    Right Eye Near:   Left Eye Near:    Bilateral Near:     Physical Exam Vitals and nursing note reviewed.  Constitutional:      General: She is not in acute distress.    Appearance: She is well-developed.  HENT:     Head: Normocephalic and atraumatic.  Eyes:     Conjunctiva/sclera: Conjunctivae normal.  Cardiovascular:     Rate and Rhythm: Normal rate and regular rhythm.     Heart sounds: No murmur heard. Pulmonary:     Effort: Pulmonary effort is normal. No respiratory distress.     Breath sounds: Normal breath sounds.  Abdominal:     Palpations: Abdomen is  soft.     Tenderness: There is no abdominal tenderness.  Musculoskeletal:        General: No swelling.     Cervical back: Neck supple.  Skin:    General: Skin is warm and dry.     Capillary Refill: Capillary refill takes less than 2 seconds.     Findings: Rash present.     Comments: Small erythematous macules at multiple spots of the right and left face. Right side of face has several pus-filled vesicles that are minimal in size and non-tender. No skin desquamation.  Neurological:     Mental Status: She is alert.  Psychiatric:        Mood and Affect: Mood normal.      UC Treatments / Results  Labs (all labs ordered are listed, but only abnormal results are displayed) Labs Reviewed - No data to display  EKG   Radiology No results found.  Procedures Procedures (including critical care time)  Medications Ordered in UC Medications - No data to display  Initial Impression / Assessment and Plan / UC Course  I have reviewed the triage vital signs and the nursing notes.  Pertinent labs & imaging results that were available during my care of the patient were reviewed by me and considered in my medical decision making (see chart for details).  Patient presents to urgent care with concerns of a rash.  Reportedly has had this ongoing for the last several days not responding to topical Benadryl or any other interventions.  She denies any significant pain, swelling, or drainage from the sites although she does report that she has popped a few of them and they have had a pus filled type fluid.  Gluteal reveals multiple erythematous macules on bilateral sides of the face with several fluid-filled vesicles that appear to be  pus and not clear.  Appearance not consistent with shingles.  Suspect likely contact irritant that is causing his current symptoms.  Will start a course of topical steroids as well as mupirocin  ointment given patient's reported yellow crusting that she has gotten on  several occasions.  Currently does not appear to be impetigo.  Advise return precautions such as concerns for worsening or spreading rash.  Otherwise stable for outpatient follow-up and discharge home. Final Clinical Impressions(s) / UC Diagnoses   Final diagnoses:  Irritant contact dermatitis, unspecified trigger     Discharge Instructions      You were seen in urgent care today for concerns of a rash.  You appear to have likely contact dermatitis from some sort of irritant.  I am starting on several different medications to help calm the skin on the face. Desonide  is a steroid which will be primary to help with the redness, discomfort, irritation that you are noticing.  The mupirocin  to be helpful if you begin to develop any yellow crusting of the areas as this is more concerning for a bacterial infection.  Please use these as prescribed.  For any concerns or new or worsening symptoms, return to urgent care or seek follow-up with your primary care provider.     ED Prescriptions     Medication Sig Dispense Auth. Provider   desonide  (DESOWEN ) 0.05 % cream Apply topically 2 (two) times daily. 30 g Warren Lindahl A, PA-C   mupirocin  cream (BACTROBAN ) 2 % Apply 1 Application topically 2 (two) times daily. 15 g Saladin Petrelli A, PA-C      PDMP not reviewed this encounter.   Maysin Carstens A, PA-C 05/22/24 1303

## 2024-05-22 NOTE — Discharge Instructions (Signed)
 You were seen in urgent care today for concerns of a rash.  You appear to have likely contact dermatitis from some sort of irritant.  I am starting on several different medications to help calm the skin on the face. Desonide  is a steroid which will be primary to help with the redness, discomfort, irritation that you are noticing.  The mupirocin  to be helpful if you begin to develop any yellow crusting of the areas as this is more concerning for a bacterial infection.  Please use these as prescribed.  For any concerns or new or worsening symptoms, return to urgent care or seek follow-up with your primary care provider.

## 2024-10-03 ENCOUNTER — Ambulatory Visit: Admitting: Family

## 2024-10-03 ENCOUNTER — Encounter: Payer: Self-pay | Admitting: Family

## 2024-10-03 VITALS — BP 123/83 | HR 50 | Temp 98.7°F | Resp 16 | Ht 66.0 in | Wt 188.4 lb

## 2024-10-03 DIAGNOSIS — M549 Dorsalgia, unspecified: Secondary | ICD-10-CM | POA: Diagnosis not present

## 2024-10-03 DIAGNOSIS — J3089 Other allergic rhinitis: Secondary | ICD-10-CM

## 2024-10-03 DIAGNOSIS — Z131 Encounter for screening for diabetes mellitus: Secondary | ICD-10-CM | POA: Diagnosis not present

## 2024-10-03 DIAGNOSIS — M25569 Pain in unspecified knee: Secondary | ICD-10-CM | POA: Diagnosis not present

## 2024-10-03 DIAGNOSIS — G8929 Other chronic pain: Secondary | ICD-10-CM

## 2024-10-03 MED ORDER — IBUPROFEN 800 MG PO TABS: 800.0000 mg | ORAL_TABLET | Freq: Three times a day (TID) | ORAL | 2 refills | Status: DC | PRN

## 2024-10-03 MED ORDER — MONTELUKAST SODIUM 10 MG PO TABS: 10.0000 mg | ORAL_TABLET | Freq: Every day | ORAL | 0 refills | Status: DC

## 2024-10-03 MED ORDER — CETIRIZINE HCL 10 MG PO TABS: 10.0000 mg | ORAL_TABLET | Freq: Every day | ORAL | 0 refills | Status: DC

## 2024-10-03 MED ORDER — LIDOCAINE 5 % EX PTCH: 1.0000 | MEDICATED_PATCH | CUTANEOUS | 0 refills | Status: DC

## 2024-10-03 NOTE — Progress Notes (Signed)
"   Follow up Patient wants her [facility] check for diabetes, patient wants a 90 prescription of lidocaine  patches because of shipping "

## 2024-10-03 NOTE — Progress Notes (Signed)
 "   Patient ID: Kaitlyn Campos, female    DOB: 15-Mar-1961  MRN: [id number]  CC: Chronic Conditions Follow-Up  Subjective: [name] is a 64 y.o. female who presents for chronic conditions follow-up.   Her concerns today include:  - Allergies. Doing well on Cetirizine  and Montelukast , no issues/concerns. - Chronic back pain and chronic knee pain. Doing well on Lidocaine  patches and Ibuprofen .  - Diabetes screening.   Patient Active Problem List   Diagnosis Date Noted   Adjustment insomnia 04/10/2024   Status post bariatric surgery 04/10/2024   Bariatric surgery status 04/10/2024   Encounter for special screening examination for cardiovascular disorder 04/10/2024   Mass of abdomen 04/10/2024   Body mass index 35.0-35.9, adult 04/10/2024   Breast cancer (HCC) 04/10/2024   Carcinoma in situ of breast 04/10/2024   Allergic rhinitis 04/10/2024   Chronic rhinitis 04/10/2024   Difficulty breathing 04/10/2024   Dyspepsia 04/10/2024   Encounter for laboratory test 04/10/2024   Dietary counseling and surveillance 04/10/2024   Dysphagia 04/10/2024   Fitting and adjustment of gastric lap band 04/10/2024   Encounters for administrative purposes 04/10/2024   Other specified counseling 04/10/2024   Enthesitis 04/10/2024   Gastroenteritis 04/10/2024   Female stress incontinence 04/10/2024   Esophageal reflux 04/10/2024   Generalized anxiety disorder 04/10/2024   Limitation of activities due to disability 04/10/2024   Maladaptive health behaviors affecting medical condition 04/10/2024   Morbid obesity (HCC) 04/10/2024   Oligomenorrhea 04/10/2024   Overweight 04/10/2024   Postprocedural state 04/10/2024   Pre-operative examination 04/10/2024   Scar 04/10/2024   Drug allergy 04/10/2024   Stomatitis 04/10/2024   Venereal wart 04/10/2024   Hyperlipidemia 07/20/2023   Encounter for general adult medical examination without abnormal findings 07/15/2023   Herpes simplex 07/15/2023   Pain  07/15/2023   Benign paroxysmal vertigo, bilateral 03/02/2017   History of breast cancer 01/05/2017   Raynaud's phenomenon without gangrene 01/05/2017   Seasonal allergies 01/05/2017   Syncope 01/05/2017   Pain in left hip 04/05/2015     Medications Ordered Prior to Encounter[1]  Allergies[2]  Social History   Socioeconomic History   Marital status: Married    Spouse name: Not on file   Number of children: Not on file   Years of education: Not on file   Highest education level: Not on file  Occupational History   Not on file  Tobacco Use   Smoking status: Never   Smokeless tobacco: Never  Vaping Use   Vaping status: Never Used  Substance and Sexual Activity   Alcohol use: Never   Drug use: Never   Sexual activity: Not Currently  Other Topics Concern   Not on file  Social History Narrative   Not on file   Social Drivers of Health   Tobacco Use: Low Risk (10/03/2024)   Patient History    Smoking Tobacco Use: Never    Smokeless Tobacco Use: Never    Passive Exposure: Not on file  Financial Resource Strain: Not on file  Food Insecurity: Not on file  Transportation Needs: Not on file  Physical Activity: Not on file  Stress: Not on file  Social Connections: Not on file  Intimate Partner Violence: Not on file  Depression (PHQ2-9): Low Risk (10/03/2024)   Depression (PHQ2-9)    PHQ-2 Score: 0  Alcohol Screen: Not on file  Housing: Unknown (11/19/2023)   Received from [facility]   Epic    Unable to Pay for Housing  in the Last Year: Not on file    Number of Times Moved in the Last Year: Not on file    At any time in the past 12 months, were you homeless or living in a shelter (including now)?: No  Utilities: Not on file  Health Literacy: Not on file    Family History  Problem Relation Age of Onset   Esophageal cancer Father    Colon cancer Neg Hx    Rectal cancer Neg Hx    Stomach cancer Neg Hx     Past Surgical History:  Procedure  Laterality Date   APPENDECTOMY     BREAST LUMPECTOMY Right    LAPAROSCOPIC REPAIR AND REMOVAL OF GASTRIC BAND  2010   PLACEMENT OF BREAST IMPLANTS Bilateral 2001   REPLACEMENT TOTAL KNEE Right 2018   TONSILECTOMY, ADENOIDECTOMY, BILATERAL MYRINGOTOMY AND TUBES     TUBAL LIGATION      ROS: Review of Systems Negative except as stated above  PHYSICAL EXAM: BP 123/83   Pulse (!) 50   Temp 98.7 F (37.1 C) (Oral)   Resp 16   Ht 5' 6 (1.676 m)   Wt 188 lb 6.4 oz (85.5 kg)   SpO2 97%   BMI 30.41 kg/m   Physical Exam HENT:     Head: Normocephalic and atraumatic.     Nose: Nose normal.     Mouth/Throat:     Mouth: Mucous membranes are moist.     Pharynx: Oropharynx is clear.  Eyes:     Extraocular Movements: Extraocular movements intact.     Conjunctiva/sclera: Conjunctivae normal.     Pupils: Pupils are equal, round, and reactive to light.  Cardiovascular:     Rate and Rhythm: Bradycardia present.     Pulses: Normal pulses.     Heart sounds: Normal heart sounds.  Pulmonary:     Effort: Pulmonary effort is normal.     Breath sounds: Normal breath sounds.  Musculoskeletal:        General: Normal range of motion.     Right shoulder: Normal.     Left shoulder: Normal.     Right upper arm: Normal.     Left upper arm: Normal.     Right elbow: Normal.     Left elbow: Normal.     Right forearm: Normal.     Left forearm: Normal.     Right wrist: Normal.     Left wrist: Normal.     Right hand: Normal.     Left hand: Normal.     Cervical back: Normal, normal range of motion and neck supple.     Thoracic back: Normal.     Lumbar back: Normal.     Right hip: Normal.     Left hip: Normal.     Right upper leg: Normal.     Left upper leg: Normal.     Right knee: Normal.     Left knee: Normal.     Right lower leg: Normal.     Left lower leg: Normal.     Right ankle: Normal.     Left ankle: Normal.     Right foot: Normal.     Left foot: Normal.  Neurological:      General: No focal deficit present.     Mental Status: She is alert and oriented to person, place, and time.  Psychiatric:        Mood and Affect: Mood normal.  Behavior: Behavior normal.     ASSESSMENT AND PLAN: 1. Perennial allergic rhinitis (Primary) - Continue Montelukast  and Cetirizine  as prescribed. Counseled on medication adherence/adverse effects.  - Follow-up with primary provider as scheduled.  - montelukast  (SINGULAIR ) 10 MG tablet; Take 1 tablet (10 mg total) by mouth at bedtime.  Dispense: 90 tablet; Refill: 0 - cetirizine  (ZYRTEC ) 10 MG tablet; Take 1 tablet (10 mg total) by mouth daily.  Dispense: 90 tablet; Refill: 0  2. Chronic knee pain, unspecified laterality 3. Chronic back pain, unspecified back location, unspecified back pain laterality - Continue Ibuprofen  and Lidocaine  as prescribed. Counseled on medication adherence/adverse effects.  - Follow-up with primary provider as scheduled.  - ibuprofen  (ADVIL ) 800 MG tablet; Take 1 tablet (800 mg total) by mouth every 8 (eight) hours as needed.  Dispense: 90 tablet; Refill: 2 - lidocaine  (LIDODERM ) 5 %; Place 1 patch onto the skin daily.  Dispense: 90 patch; Refill: 0  4. Diabetes mellitus screening - Routine screening.  - Hemoglobin A1c   Patient was given the opportunity to ask questions.  Patient verbalized understanding of the plan and was able to repeat key elements of the plan. Patient was given clear instructions to go to Emergency Department or return to medical center if symptoms don't improve, worsen, or new problems develop.The patient verbalized understanding.   Orders Placed This Encounter  Procedures   Hemoglobin A1c     Requested Prescriptions   Signed Prescriptions Disp Refills   montelukast  (SINGULAIR ) 10 MG tablet 90 tablet 0    Sig: Take 1 tablet (10 mg total) by mouth at bedtime.   cetirizine  (ZYRTEC ) 10 MG tablet 90 tablet 0    Sig: Take 1 tablet (10 mg total) by mouth daily.    ibuprofen  (ADVIL ) 800 MG tablet 90 tablet 2    Sig: Take 1 tablet (800 mg total) by mouth every 8 (eight) hours as needed.   lidocaine  (LIDODERM ) 5 % 90 patch 0    Sig: Place 1 patch onto the skin daily.    Return for Follow-Up or next available with Raguel Blush, MD.  Greig JINNY Chute, NP      [1]  Current Outpatient Medications on File Prior to Visit  Medication Sig Dispense Refill   atorvastatin (LIPITOR) 20 MG tablet Take 20 mg by mouth.     albuterol  (VENTOLIN  HFA) 108 (90 Base) MCG/ACT inhaler Inhale 1 puff into the lungs every 6 (six) hours as needed for wheezing or shortness of breath. (Patient not taking: Reported on 05/02/2024) 6.7 g 0   atorvastatin (LIPITOR) 10 MG tablet Take 10 mg by mouth daily. (Patient not taking: Reported on 05/22/2024)     calcium  carbonate (TUMS EX) 750 MG chewable tablet Chew 1 tablet by mouth 2 (two) times daily. (Patient not taking: Reported on 05/02/2024)     cephALEXin  (KEFLEX ) 500 MG capsule Take 500 mg by mouth 3 (three) times daily. (Patient not taking: Reported on 05/02/2024)     Cephalexin  500 MG tablet Take 500 mg by mouth 3 (three) times daily. (Patient not taking: Reported on 05/02/2024)     desonide  (DESOWEN ) 0.05 % cream Apply topically 2 (two) times daily. (Patient not taking: Reported on 10/03/2024) 30 g 0   hydrocortisone cream 1 % Apply 1 Application topically 2 (two) times daily. (Patient not taking: Reported on 05/22/2024)     ibuprofen  (ADVIL ) 600 MG tablet Take 600 mg by mouth every 6 (six) hours as needed. (Patient not taking: Reported on 10/03/2024)  mupirocin  cream (BACTROBAN ) 2 % Apply 1 Application topically 2 (two) times daily. (Patient not taking: Reported on 10/03/2024) 15 g 0   phentermine (ADIPEX-P) 37.5 MG tablet Take 37.5 mg by mouth daily. (Patient not taking: Reported on 05/22/2024)     phentermine 15 MG capsule Take 15 mg by mouth every morning. (Patient not taking: Reported on 10/03/2024)     predniSONE  (DELTASONE ) 10 MG tablet Take 1  tablet by mouth daily. (Patient not taking: Reported on 05/22/2024)     topiramate (TOPAMAX) 25 MG tablet Take 25 mg by mouth daily. (Patient not taking: Reported on 05/02/2024)     topiramate (TOPAMAX) 50 MG tablet Take 50 mg by mouth daily. (Patient not taking: Reported on 10/03/2024)     valACYclovir (VALTREX) 1000 MG tablet Take 1 g by mouth as directed. (Patient not taking: Reported on 05/22/2024)     No current facility-administered medications on file prior to visit.  [2]  Allergies Allergen Reactions   Fluconazole Hives    Other Reaction(s): Rash   Nifedipine     Other Reaction(s): Swelling In Feet, Unknown   "

## 2024-10-04 ENCOUNTER — Ambulatory Visit: Payer: Self-pay | Admitting: Family

## 2024-10-04 LAB — HEMOGLOBIN A1C
Est. average glucose Bld gHb Est-mCnc: 117 mg/dL
Hgb A1c MFr Bld: 5.7 % — ABNORMAL HIGH (ref 4.8–5.6)

## 2024-10-06 ENCOUNTER — Other Ambulatory Visit: Payer: Self-pay | Admitting: Family Medicine

## 2024-10-06 NOTE — Telephone Encounter (Signed)
 Copied from CRM [id number]. Topic: Clinical - Medication Refill >> Oct 06, 2024 11:42 AM Delon HERO wrote: Medication: atorvastatin  (LIPITOR) 20 MG tablet [[id number]] 90 day script  Patient was in the office 10/04/2024 Brought the bottle in to refill. It was over looked. The rest of the scripts were sent in.  Has the patient contacted their pharmacy? Yes (Agent: If no, request that the patient contact the pharmacy for the refill. If patient does not wish to contact the pharmacy document the reason why and proceed with request.) (Agent: If yes, when and what did the pharmacy advise?)  This is the patient's preferred pharmacy:    [facility] - [place], [place] - [address] [place] [address] Phone: [phone] Fax: [phone]  Is this the correct pharmacy for this prescription? Yes If no, delete pharmacy and type the correct one.   Has the prescription been filled recently? Yes  Is the patient out of the medication? Yes  Has the patient been seen for an appointment in the last year OR does the patient have an upcoming appointment? Yes  Can we respond through MyChart? Yes  Agent: Please be advised that Rx refills may take up to 3 business days. We ask that you follow-up with your pharmacy.

## 2024-10-09 NOTE — Telephone Encounter (Signed)
 Requested medications are due for refill today.  unsure  Requested medications are on the active medications list.  yes  Last refill. 05/22/2024   Future visit scheduled.   yes  Notes to clinic.  Medication is historical.    Requested Prescriptions  Pending Prescriptions Disp Refills   atorvastatin  (LIPITOR) 10 MG tablet      Sig: Take 1 tablet (10 mg total) by mouth daily.     Cardiovascular:  Antilipid - Statins Failed - 10/09/2024 11:17 AM      Failed - Lipid Panel in normal range within the last 12 months    No results found for: CHOL, POCCHOL, CHOLTOT No results found for: LDLCALC, LDLC, HIRISKLDL, POCLDL, LDLDIRECT, REALLDLC, TOTLDLC No results found for: HDL, POCHDL No results found for: TRIG, POCTRIG       Passed - Patient is not pregnant      Passed - Valid encounter within last 12 months    Recent Outpatient Visits           6 days ago Perennial allergic rhinitis   [facility] [place], NP   5 months ago History of bariatric surgery    [facility] [place], [place]

## 2024-10-12 MED ORDER — ATORVASTATIN CALCIUM 10 MG PO TABS: 10.0000 mg | ORAL_TABLET | Freq: Every day | ORAL | 0 refills | Status: AC

## 2024-11-02 ENCOUNTER — Encounter: Payer: Self-pay | Admitting: Family Medicine

## 2024-11-02 ENCOUNTER — Ambulatory Visit: Admitting: Family Medicine

## 2024-11-02 VITALS — BP 102/64 | HR 94 | Ht 66.0 in | Wt 192.6 lb

## 2024-11-02 DIAGNOSIS — Z131 Encounter for screening for diabetes mellitus: Secondary | ICD-10-CM

## 2024-11-02 DIAGNOSIS — Z136 Encounter for screening for cardiovascular disorders: Secondary | ICD-10-CM

## 2024-11-02 DIAGNOSIS — G8929 Other chronic pain: Secondary | ICD-10-CM

## 2024-11-02 DIAGNOSIS — Z130 Encounter for screening for diseases of the blood and blood-forming organs and certain disorders involving the immune mechanism: Secondary | ICD-10-CM

## 2024-11-02 DIAGNOSIS — Z1159 Encounter for screening for other viral diseases: Secondary | ICD-10-CM

## 2024-11-02 DIAGNOSIS — Z13820 Encounter for screening for osteoporosis: Secondary | ICD-10-CM

## 2024-11-02 DIAGNOSIS — J3089 Other allergic rhinitis: Secondary | ICD-10-CM

## 2024-11-02 DIAGNOSIS — Z0000 Encounter for general adult medical examination without abnormal findings: Secondary | ICD-10-CM

## 2024-11-02 DIAGNOSIS — Z114 Encounter for screening for human immunodeficiency virus [HIV]: Secondary | ICD-10-CM

## 2024-11-02 MED ORDER — CETIRIZINE HCL 10 MG PO TABS: 10.0000 mg | ORAL_TABLET | Freq: Every day | ORAL | 3 refills | Status: AC

## 2024-11-02 MED ORDER — IBUPROFEN 800 MG PO TABS: 800.0000 mg | ORAL_TABLET | Freq: Three times a day (TID) | ORAL | 3 refills | Status: AC | PRN

## 2024-11-02 MED ORDER — MONTELUKAST SODIUM 10 MG PO TABS: 10.0000 mg | ORAL_TABLET | Freq: Every day | ORAL | 3 refills | Status: AC

## 2024-11-02 MED ORDER — LIDOCAINE 5 % EX PTCH: 1.0000 | MEDICATED_PATCH | CUTANEOUS | 3 refills | Status: AC

## 2024-11-02 MED ORDER — ATORVASTATIN CALCIUM 20 MG PO TABS: 20.0000 mg | ORAL_TABLET | Freq: Every day | ORAL | 3 refills | Status: AC

## 2024-11-02 NOTE — Progress Notes (Unsigned)
 "  Established Patient Office Visit  Subjective    Patient ID: Kaitlyn Campos, female    DOB: 02/15/1961  Age: 64 y.o. MRN: [id number]  CC:  Chief Complaint  Patient presents with   Medical Management of Chronic Issues    Pt would like lab work today     HPI Kaitlyn Campos presents for routine annual exam with med refills. Patient denies acute complaints.   Outpatient Encounter Medications as of 11/02/2024  Medication Sig   [DISCONTINUED] atorvastatin  (LIPITOR) 20 MG tablet Take 20 mg by mouth.   [DISCONTINUED] cetirizine  (ZYRTEC ) 10 MG tablet Take 1 tablet (10 mg total) by mouth daily.   [DISCONTINUED] ibuprofen  (ADVIL ) 800 MG tablet Take 1 tablet (800 mg total) by mouth every 8 (eight) hours as needed.   [DISCONTINUED] montelukast  (SINGULAIR ) 10 MG tablet Take 1 tablet (10 mg total) by mouth at bedtime.   albuterol  (VENTOLIN  HFA) 108 (90 Base) MCG/ACT inhaler Inhale 1 puff into the lungs every 6 (six) hours as needed for wheezing or shortness of breath. (Patient not taking: Reported on 05/02/2024)   atorvastatin  (LIPITOR) 10 MG tablet Take 1 tablet (10 mg total) by mouth daily.   atorvastatin  (LIPITOR) 20 MG tablet Take 1 tablet (20 mg total) by mouth daily.   calcium  carbonate (TUMS EX) 750 MG chewable tablet Chew 1 tablet by mouth 2 (two) times daily. (Patient not taking: Reported on 05/02/2024)   cephALEXin  (KEFLEX ) 500 MG capsule Take 500 mg by mouth 3 (three) times daily. (Patient not taking: Reported on 05/02/2024)   Cephalexin  500 MG tablet Take 500 mg by mouth 3 (three) times daily. (Patient not taking: Reported on 05/02/2024)   cetirizine  (ZYRTEC ) 10 MG tablet Take 1 tablet (10 mg total) by mouth daily.   desonide  (DESOWEN ) 0.05 % cream Apply topically 2 (two) times daily. (Patient not taking: Reported on 10/03/2024)   hydrocortisone cream 1 % Apply 1 Application topically 2 (two) times daily. (Patient not taking: Reported on 05/22/2024)   ibuprofen  (ADVIL ) 800 MG tablet Take 1 tablet (800  mg total) by mouth every 8 (eight) hours as needed.   lidocaine  (LIDODERM ) 5 % Place 1 patch onto the skin daily.   montelukast  (SINGULAIR ) 10 MG tablet Take 1 tablet (10 mg total) by mouth at bedtime.   mupirocin  cream (BACTROBAN ) 2 % Apply 1 Application topically 2 (two) times daily. (Patient not taking: Reported on 10/03/2024)   phentermine (ADIPEX-P) 37.5 MG tablet Take 37.5 mg by mouth daily. (Patient not taking: Reported on 05/22/2024)   phentermine 15 MG capsule Take 15 mg by mouth every morning. (Patient not taking: Reported on 10/03/2024)   predniSONE  (DELTASONE ) 10 MG tablet Take 1 tablet by mouth daily. (Patient not taking: Reported on 05/22/2024)   topiramate (TOPAMAX) 25 MG tablet Take 25 mg by mouth daily. (Patient not taking: Reported on 05/02/2024)   topiramate (TOPAMAX) 50 MG tablet Take 50 mg by mouth daily. (Patient not taking: Reported on 10/03/2024)   valACYclovir (VALTREX) 1000 MG tablet Take 1 g by mouth as directed. (Patient not taking: Reported on 05/22/2024)   [DISCONTINUED] ibuprofen  (ADVIL ) 600 MG tablet Take 600 mg by mouth every 6 (six) hours as needed. (Patient not taking: Reported on 10/03/2024)   [DISCONTINUED] lidocaine  (LIDODERM ) 5 % Place 1 patch onto the skin daily.   No facility-administered encounter medications on file as of 11/02/2024.    Past Medical History:  Diagnosis Date   Allergy    Cancer (HCC)  breast cancer  right    Hyperlipidemia    Seasonal allergies     Past Surgical History:  Procedure Laterality Date   APPENDECTOMY     BREAST LUMPECTOMY Right    LAPAROSCOPIC REPAIR AND REMOVAL OF GASTRIC BAND  2010   PLACEMENT OF BREAST IMPLANTS Bilateral 2001   REPLACEMENT TOTAL KNEE Right 2018   TONSILECTOMY, ADENOIDECTOMY, BILATERAL MYRINGOTOMY AND TUBES     TUBAL LIGATION      Family History  Problem Relation Age of Onset   Esophageal cancer Father    Colon cancer Neg Hx    Rectal cancer Neg Hx    Stomach cancer Neg Hx     Social History    Socioeconomic History   Marital status: Married    Spouse name: Not on file   Number of children: Not on file   Years of education: Not on file   Highest education level: Not on file  Occupational History   Not on file  Tobacco Use   Smoking status: Never   Smokeless tobacco: Never  Vaping Use   Vaping status: Never Used  Substance and Sexual Activity   Alcohol use: Never   Drug use: Never   Sexual activity: Not Currently  Other Topics Concern   Not on file  Social History Narrative   Not on file   Social Drivers of Health   Tobacco Use: Low Risk (11/03/2024)   Patient History    Smoking Tobacco Use: Never    Smokeless Tobacco Use: Never    Passive Exposure: Not on file  Financial Resource Strain: Not on file  Food Insecurity: Not on file  Transportation Needs: Not on file  Physical Activity: Not on file  Stress: Not on file  Social Connections: Not on file  Intimate Partner Violence: Not on file  Depression (PHQ2-9): Low Risk (10/03/2024)   Depression (PHQ2-9)    PHQ-2 Score: 0  Alcohol Screen: Not on file  Housing: Unknown (11/19/2023)   Received from [facility]   Epic    Unable to Pay for Housing in the Last Year: Not on file    Number of Times Moved in the Last Year: Not on file    At any time in the past 12 months, were you homeless or living in a shelter (including now)?: No  Utilities: Not on file  Health Literacy: Not on file    Review of Systems  All other systems reviewed and are negative.       Objective    BP 102/64   Pulse 94   Ht 5' 6 (1.676 m)   Wt 192 lb 9.6 oz (87.4 kg)   SpO2 98%   BMI 31.09 kg/m   Physical Exam Vitals and nursing note reviewed.  Constitutional:      General: She is not in acute distress. HENT:     Head: Normocephalic and atraumatic.     Right Ear: Tympanic membrane, ear canal and external ear normal.     Left Ear: Tympanic membrane, ear canal and external ear normal.     Nose: Nose  normal.     Mouth/Throat:     Mouth: Mucous membranes are moist.     Pharynx: Oropharynx is clear.  Eyes:     Conjunctiva/sclera: Conjunctivae normal.     Pupils: Pupils are equal, round, and reactive to light.  Neck:     Thyroid: No thyromegaly.  Cardiovascular:     Rate and Rhythm: Normal rate and  regular rhythm.     Heart sounds: Normal heart sounds. No murmur heard. Pulmonary:     Effort: Pulmonary effort is normal. No respiratory distress.     Breath sounds: Normal breath sounds.  Abdominal:     General: There is no distension.     Palpations: Abdomen is soft. There is no mass.     Tenderness: There is no abdominal tenderness.  Musculoskeletal:        General: Normal range of motion.     Cervical back: Normal range of motion and neck supple.  Skin:    General: Skin is warm and dry.  Neurological:     General: No focal deficit present.     Mental Status: She is alert and oriented to person, place, and time.  Psychiatric:        Mood and Affect: Mood normal.        Behavior: Behavior normal.         Assessment & Plan:   Annual physical exam -     Comprehensive metabolic panel with GFR  Screening for deficiency anemia  Encounter for screening for cardiovascular disorders -     Lipid panel  Screening for endocrine/metabolic/immunity disorders  Encounter for osteoporosis screening in asymptomatic postmenopausal patient -     DG Bone Density; Future  Need for hepatitis C screening test -     Hepatitis C antibody  Screening for HIV (human immunodeficiency virus) -     HIV Antibody (routine testing w rflx)  Perennial allergic rhinitis -     Cetirizine  HCl; Take 1 tablet (10 mg total) by mouth daily.  Dispense: 90 tablet; Refill: 3 -     Montelukast  Sodium; Take 1 tablet (10 mg total) by mouth at bedtime.  Dispense: 90 tablet; Refill: 3  Chronic back pain, unspecified back location, unspecified back pain laterality -     Lidocaine ; Place 1 patch onto the skin  daily.  Dispense: 90 patch; Refill: 3 -     Ibuprofen ; Take 1 tablet (800 mg total) by mouth every 8 (eight) hours as needed.  Dispense: 90 tablet; Refill: 3  Other orders -     Atorvastatin  Calcium ; Take 1 tablet (20 mg total) by mouth daily.  Dispense: 90 tablet; Refill: 3     No follow-ups on file.   Tanda Raguel SQUIBB, MD  "

## 2024-11-03 ENCOUNTER — Ambulatory Visit: Payer: Self-pay | Admitting: Family Medicine

## 2024-11-03 ENCOUNTER — Encounter: Payer: Self-pay | Admitting: Family Medicine

## 2024-11-03 LAB — COMPREHENSIVE METABOLIC PANEL WITH GFR
ALT: 16 [IU]/L (ref 0–32)
AST: 17 [IU]/L (ref 0–40)
Albumin: 4.3 g/dL (ref 3.9–4.9)
Alkaline Phosphatase: 96 [IU]/L (ref 49–135)
BUN/Creatinine Ratio: 17 (ref 12–28)
BUN: 18 mg/dL (ref 8–27)
Bilirubin Total: 0.4 mg/dL (ref 0.0–1.2)
CO2: 22 mmol/L (ref 20–29)
Calcium: 9.5 mg/dL (ref 8.7–10.3)
Chloride: 108 mmol/L — ABNORMAL HIGH (ref 96–106)
Creatinine, Ser: 1.07 mg/dL — ABNORMAL HIGH (ref 0.57–1.00)
Globulin, Total: 2.7 g/dL (ref 1.5–4.5)
Glucose: 83 mg/dL (ref 70–99)
Potassium: 4.6 mmol/L (ref 3.5–5.2)
Sodium: 144 mmol/L (ref 134–144)
Total Protein: 7 g/dL (ref 6.0–8.5)
eGFR: 58 mL/min/{1.73_m2} — ABNORMAL LOW

## 2024-11-03 LAB — LIPID PANEL
Chol/HDL Ratio: 3.2 ratio (ref 0.0–4.4)
Cholesterol, Total: 192 mg/dL (ref 100–199)
HDL: 60 mg/dL
LDL Chol Calc (NIH): 114 mg/dL — ABNORMAL HIGH (ref 0–99)
Triglycerides: 98 mg/dL (ref 0–149)
VLDL Cholesterol Cal: 18 mg/dL (ref 5–40)

## 2024-11-03 LAB — HIV ANTIBODY (ROUTINE TESTING W REFLEX): HIV Screen 4th Generation wRfx: NONREACTIVE

## 2024-11-03 LAB — HEPATITIS C ANTIBODY: Hep C Virus Ab: NONREACTIVE
